# Patient Record
Sex: Female | Born: 1965 | Race: Black or African American | Hispanic: No | Marital: Single | Smoking: Current every day smoker
Health system: Southern US, Community
[De-identification: ages and names within clinical notes are randomized; demographics above are authoritative.]

## PROBLEM LIST (undated history)

## (undated) DIAGNOSIS — N189 Chronic kidney disease, unspecified: Secondary | ICD-10-CM

## (undated) DIAGNOSIS — Z993 Dependence on wheelchair: Secondary | ICD-10-CM

## (undated) DIAGNOSIS — F17200 Nicotine dependence, unspecified, uncomplicated: Secondary | ICD-10-CM

## (undated) DIAGNOSIS — I10 Essential (primary) hypertension: Secondary | ICD-10-CM

## (undated) DIAGNOSIS — E11319 Type 2 diabetes mellitus with unspecified diabetic retinopathy without macular edema: Secondary | ICD-10-CM

## (undated) DIAGNOSIS — I739 Peripheral vascular disease, unspecified: Secondary | ICD-10-CM

## (undated) DIAGNOSIS — E785 Hyperlipidemia, unspecified: Secondary | ICD-10-CM

## (undated) DIAGNOSIS — I2699 Other pulmonary embolism without acute cor pulmonale: Secondary | ICD-10-CM

## (undated) DIAGNOSIS — J45909 Unspecified asthma, uncomplicated: Secondary | ICD-10-CM

## (undated) DIAGNOSIS — I82409 Acute embolism and thrombosis of unspecified deep veins of unspecified lower extremity: Secondary | ICD-10-CM

## (undated) DIAGNOSIS — E119 Type 2 diabetes mellitus without complications: Secondary | ICD-10-CM

## (undated) DIAGNOSIS — G629 Polyneuropathy, unspecified: Secondary | ICD-10-CM

## (undated) DIAGNOSIS — J811 Chronic pulmonary edema: Secondary | ICD-10-CM

## (undated) DIAGNOSIS — D649 Anemia, unspecified: Secondary | ICD-10-CM

## (undated) DIAGNOSIS — Z992 Dependence on renal dialysis: Secondary | ICD-10-CM

## (undated) DIAGNOSIS — I8390 Asymptomatic varicose veins of unspecified lower extremity: Secondary | ICD-10-CM

## (undated) DIAGNOSIS — N186 End stage renal disease: Secondary | ICD-10-CM

## (undated) HISTORY — PX: EYE SURGERY: SHX253

## (undated) HISTORY — PX: TONSILLECTOMY: SUR1361

## (undated) HISTORY — DX: Essential (primary) hypertension: I10

## (undated) HISTORY — DX: Asymptomatic varicose veins of unspecified lower extremity: I83.90

---
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
## 2009-06-20 HISTORY — PX: TUBAL LIGATION: SHX77

## 2010-02-08 ENCOUNTER — Ambulatory Visit: Payer: Self-pay | Admitting: Obstetrics & Gynecology

## 2010-02-08 ENCOUNTER — Encounter: Admission: RE | Admit: 2010-02-08 | Discharge: 2010-03-18 | Payer: Self-pay | Admitting: Obstetrics & Gynecology

## 2010-02-09 ENCOUNTER — Ambulatory Visit (HOSPITAL_COMMUNITY): Admission: RE | Admit: 2010-02-09 | Discharge: 2010-02-09 | Payer: Self-pay | Admitting: Obstetrics & Gynecology

## 2010-02-15 ENCOUNTER — Ambulatory Visit: Payer: Self-pay | Admitting: Obstetrics & Gynecology

## 2010-02-15 LAB — CONVERTED CEMR LAB
ALT: 8 units/L (ref 0–35)
Alkaline Phosphatase: 70 units/L (ref 39–117)
AntiThromb III Func: 106 % (ref 76–126)
Anticardiolipin IgA: 2 (ref <22)
Anticardiolipin IgG: 5 (ref <23)
Anticardiolipin IgM: 4 (ref <11)
Collection Interval-CRCL: 24 hr
Creatinine 24 HR UR: 1352 mg/24hr (ref 700–1800)
Creatinine Clearance: 204 mL/min — ABNORMAL HIGH (ref 75–115)
Creatinine, Ser: 0.46 mg/dL (ref 0.40–1.20)
Creatinine, Urine: 117.6 mg/dL
MCHC: 32.2 g/dL (ref 30.0–36.0)
Protein S Activity: 68 % — ABNORMAL LOW (ref 69–129)
Protein S Ag, Total: 86 % (ref 70–140)
RDW: 14.1 % (ref 11.5–15.5)
Sodium: 137 meq/L (ref 135–145)
TSH: 1.508 microintl units/mL (ref 0.350–4.500)
Total Bilirubin: 0.3 mg/dL (ref 0.3–1.2)
Total Protein: 6.2 g/dL (ref 6.0–8.3)

## 2010-03-01 ENCOUNTER — Ambulatory Visit: Payer: Self-pay | Admitting: Obstetrics & Gynecology

## 2010-03-08 ENCOUNTER — Ambulatory Visit: Payer: Self-pay | Admitting: Family Medicine

## 2010-03-08 ENCOUNTER — Encounter (INDEPENDENT_AMBULATORY_CARE_PROVIDER_SITE_OTHER): Payer: Self-pay | Admitting: *Deleted

## 2010-03-08 LAB — CONVERTED CEMR LAB
HCT: 27.9 % — ABNORMAL LOW (ref 36.0–46.0)
MCHC: 33.3 g/dL (ref 30.0–36.0)
Platelets: 265 10*3/uL (ref 150–400)
RDW: 14.4 % (ref 11.5–15.5)

## 2010-03-15 ENCOUNTER — Ambulatory Visit: Payer: Self-pay | Admitting: Obstetrics & Gynecology

## 2010-03-15 ENCOUNTER — Encounter: Admission: RE | Admit: 2010-03-15 | Discharge: 2010-03-18 | Payer: Self-pay | Admitting: Obstetrics & Gynecology

## 2010-03-15 ENCOUNTER — Ambulatory Visit (HOSPITAL_COMMUNITY): Admission: RE | Admit: 2010-03-15 | Discharge: 2010-03-15 | Payer: Self-pay | Admitting: Obstetrics & Gynecology

## 2010-03-17 ENCOUNTER — Ambulatory Visit: Payer: Self-pay | Admitting: Obstetrics and Gynecology

## 2010-03-22 ENCOUNTER — Encounter (INDEPENDENT_AMBULATORY_CARE_PROVIDER_SITE_OTHER): Payer: Self-pay | Admitting: *Deleted

## 2010-03-22 ENCOUNTER — Ambulatory Visit: Payer: Self-pay | Admitting: Obstetrics & Gynecology

## 2010-03-26 ENCOUNTER — Ambulatory Visit: Payer: Self-pay | Admitting: Obstetrics & Gynecology

## 2010-03-29 ENCOUNTER — Ambulatory Visit: Payer: Self-pay | Admitting: Obstetrics and Gynecology

## 2010-03-29 ENCOUNTER — Encounter (INDEPENDENT_AMBULATORY_CARE_PROVIDER_SITE_OTHER): Payer: Self-pay | Admitting: *Deleted

## 2010-04-02 ENCOUNTER — Ambulatory Visit: Payer: Self-pay | Admitting: Family Medicine

## 2010-04-05 ENCOUNTER — Ambulatory Visit: Payer: Self-pay | Admitting: Family Medicine

## 2010-04-09 ENCOUNTER — Ambulatory Visit: Payer: Self-pay | Admitting: Obstetrics & Gynecology

## 2010-04-12 ENCOUNTER — Ambulatory Visit: Payer: Self-pay | Admitting: Obstetrics & Gynecology

## 2010-04-13 ENCOUNTER — Ambulatory Visit (HOSPITAL_COMMUNITY): Admission: RE | Admit: 2010-04-13 | Discharge: 2010-04-13 | Payer: Self-pay | Admitting: Obstetrics & Gynecology

## 2010-04-16 ENCOUNTER — Ambulatory Visit: Payer: Self-pay | Admitting: Obstetrics & Gynecology

## 2010-04-19 ENCOUNTER — Encounter: Payer: Self-pay | Admitting: Physician Assistant

## 2010-04-19 ENCOUNTER — Ambulatory Visit: Payer: Self-pay | Admitting: Family Medicine

## 2010-04-19 LAB — CONVERTED CEMR LAB: Chlamydia, Swab/Urine, PCR: NEGATIVE

## 2010-04-23 ENCOUNTER — Ambulatory Visit: Payer: Self-pay | Admitting: Obstetrics & Gynecology

## 2010-04-26 ENCOUNTER — Ambulatory Visit: Payer: Self-pay | Admitting: Obstetrics & Gynecology

## 2010-04-26 LAB — CONVERTED CEMR LAB: GC Probe Amp, Urine: NEGATIVE

## 2010-05-03 ENCOUNTER — Ambulatory Visit: Payer: Self-pay | Admitting: Obstetrics & Gynecology

## 2010-05-03 ENCOUNTER — Inpatient Hospital Stay (HOSPITAL_COMMUNITY)
Admission: AD | Admit: 2010-05-03 | Discharge: 2010-05-07 | Payer: Self-pay | Source: Home / Self Care | Admitting: Obstetrics & Gynecology

## 2010-05-05 HISTORY — PX: TUBAL LIGATION: SHX77

## 2010-05-27 ENCOUNTER — Ambulatory Visit: Payer: Self-pay | Admitting: Obstetrics and Gynecology

## 2010-06-04 ENCOUNTER — Ambulatory Visit: Payer: Self-pay | Admitting: Obstetrics and Gynecology

## 2010-08-31 LAB — CBC
HCT: 30.1 % — ABNORMAL LOW (ref 36.0–46.0)
HCT: 31 % — ABNORMAL LOW (ref 36.0–46.0)
HCT: 31.8 % — ABNORMAL LOW (ref 36.0–46.0)
Hemoglobin: 10.3 g/dL — ABNORMAL LOW (ref 12.0–15.0)
Hemoglobin: 10.8 g/dL — ABNORMAL LOW (ref 12.0–15.0)
Hemoglobin: 10.9 g/dL — ABNORMAL LOW (ref 12.0–15.0)
MCH: 32.9 pg (ref 26.0–34.0)
MCH: 32.9 pg (ref 26.0–34.0)
MCHC: 34.1 g/dL (ref 30.0–36.0)
MCV: 94.7 fL (ref 78.0–100.0)
MCV: 95.4 fL (ref 78.0–100.0)
MCV: 95.8 fL (ref 78.0–100.0)
Platelets: 210 10*3/uL (ref 150–400)
RBC: 3.08 MIL/uL — ABNORMAL LOW (ref 3.87–5.11)
RBC: 3.33 MIL/uL — ABNORMAL LOW (ref 3.87–5.11)
RDW: 14.5 % (ref 11.5–15.5)
RDW: 14.8 % (ref 11.5–15.5)
WBC: 6 10*3/uL (ref 4.0–10.5)
WBC: 6.6 10*3/uL (ref 4.0–10.5)
WBC: 8.5 10*3/uL (ref 4.0–10.5)

## 2010-08-31 LAB — PROTEIN / CREATININE RATIO, URINE
Creatinine, Urine: 40.3 mg/dL
Total Protein, Urine: 21 mg/dL

## 2010-08-31 LAB — COMPREHENSIVE METABOLIC PANEL
ALT: 14 U/L (ref 0–35)
Alkaline Phosphatase: 130 U/L — ABNORMAL HIGH (ref 39–117)
BUN: 6 mg/dL (ref 6–23)
Chloride: 105 mEq/L (ref 96–112)
Glucose, Bld: 86 mg/dL (ref 70–99)
Potassium: 3.7 mEq/L (ref 3.5–5.1)
Sodium: 136 mEq/L (ref 135–145)
Total Bilirubin: 0.2 mg/dL — ABNORMAL LOW (ref 0.3–1.2)
Total Protein: 6.5 g/dL (ref 6.0–8.3)

## 2010-08-31 LAB — GLUCOSE, CAPILLARY
Glucose-Capillary: 55 mg/dL — ABNORMAL LOW (ref 70–99)
Glucose-Capillary: 56 mg/dL — ABNORMAL LOW (ref 70–99)
Glucose-Capillary: 58 mg/dL — ABNORMAL LOW (ref 70–99)
Glucose-Capillary: 59 mg/dL — ABNORMAL LOW (ref 70–99)
Glucose-Capillary: 65 mg/dL — ABNORMAL LOW (ref 70–99)
Glucose-Capillary: 69 mg/dL — ABNORMAL LOW (ref 70–99)
Glucose-Capillary: 74 mg/dL (ref 70–99)
Glucose-Capillary: 78 mg/dL (ref 70–99)
Glucose-Capillary: 85 mg/dL (ref 70–99)
Glucose-Capillary: 86 mg/dL (ref 70–99)

## 2010-08-31 LAB — POCT URINALYSIS DIPSTICK
Glucose, UA: NEGATIVE mg/dL
Hgb urine dipstick: NEGATIVE
Ketones, ur: NEGATIVE mg/dL
Specific Gravity, Urine: >=1.03 (ref 1.005–1.030)
Urobilinogen, UA: 4 mg/dL — ABNORMAL HIGH (ref 0.0–1.0)

## 2010-08-31 LAB — PROTIME-INR
INR: 0.86 (ref 0.00–1.49)
INR: 0.87 (ref 0.00–1.49)
Prothrombin Time: 11.9 seconds (ref 11.6–15.2)

## 2010-08-31 LAB — MRSA PCR SCREENING: MRSA by PCR: NEGATIVE

## 2010-09-01 LAB — POCT URINALYSIS DIPSTICK
Glucose, UA: NEGATIVE mg/dL
Glucose, UA: NEGATIVE mg/dL
Glucose, UA: NEGATIVE mg/dL
Ketones, ur: NEGATIVE mg/dL
Nitrite: NEGATIVE
Nitrite: NEGATIVE
Nitrite: POSITIVE — AB
Protein, ur: 100 mg/dL — AB
Specific Gravity, Urine: >=1.03 (ref 1.005–1.030)
Urobilinogen, UA: 2 mg/dL — ABNORMAL HIGH (ref 0.0–1.0)
Urobilinogen, UA: 4 mg/dL — ABNORMAL HIGH (ref 0.0–1.0)
pH: 6 (ref 5.0–8.0)

## 2010-09-02 LAB — POCT URINALYSIS DIPSTICK
Bilirubin Urine: NEGATIVE
Glucose, UA: NEGATIVE mg/dL
Glucose, UA: NEGATIVE mg/dL
Glucose, UA: 100 mg/dL — AB
Glucose, UA: NEGATIVE mg/dL
Hgb urine dipstick: NEGATIVE
Ketones, ur: 15 mg/dL — AB
Nitrite: POSITIVE — AB
Nitrite: NEGATIVE
Nitrite: NEGATIVE
Protein, ur: 100 mg/dL — AB
Specific Gravity, Urine: >=1.03 (ref 1.005–1.030)
Specific Gravity, Urine: >=1.03 (ref 1.005–1.030)
Specific Gravity, Urine: 1.025 (ref 1.005–1.030)
Urobilinogen, UA: 2 mg/dL — ABNORMAL HIGH (ref 0.0–1.0)
Urobilinogen, UA: >=8 mg/dL (ref 0.0–1.0)
Urobilinogen, UA: 1 mg/dL (ref 0.0–1.0)
pH: 5.5 (ref 5.0–8.0)

## 2010-09-02 LAB — GLUCOSE, CAPILLARY: Glucose-Capillary: 56 mg/dL — ABNORMAL LOW (ref 70–99)

## 2010-09-03 LAB — POCT URINALYSIS DIPSTICK
Bilirubin Urine: NEGATIVE
Glucose, UA: 100 mg/dL — AB
Glucose, UA: NEGATIVE mg/dL
Ketones, ur: NEGATIVE mg/dL
Nitrite: NEGATIVE
Protein, ur: 100 mg/dL — AB
pH: 5.5 (ref 5.0–8.0)

## 2010-09-03 LAB — GLUCOSE, CAPILLARY: Glucose-Capillary: 123 mg/dL — ABNORMAL HIGH (ref 70–99)

## 2011-01-26 ENCOUNTER — Encounter: Payer: Self-pay | Admitting: *Deleted

## 2011-01-26 DIAGNOSIS — I10 Essential (primary) hypertension: Secondary | ICD-10-CM

## 2011-01-26 DIAGNOSIS — E119 Type 2 diabetes mellitus without complications: Secondary | ICD-10-CM | POA: Insufficient documentation

## 2011-01-26 DIAGNOSIS — Z86718 Personal history of other venous thrombosis and embolism: Secondary | ICD-10-CM | POA: Insufficient documentation

## 2011-02-04 ENCOUNTER — Ambulatory Visit (INDEPENDENT_AMBULATORY_CARE_PROVIDER_SITE_OTHER): Payer: BC Managed Care – PPO | Admitting: Family Medicine

## 2011-02-04 ENCOUNTER — Encounter: Payer: Self-pay | Admitting: Family Medicine

## 2011-02-04 VITALS — BP 120/81 | HR 94 | Temp 97.3°F | Ht 67.0 in | Wt 144.8 lb

## 2011-02-04 DIAGNOSIS — N898 Other specified noninflammatory disorders of vagina: Secondary | ICD-10-CM

## 2011-02-04 DIAGNOSIS — Z0000 Encounter for general adult medical examination without abnormal findings: Secondary | ICD-10-CM

## 2011-02-04 NOTE — Progress Notes (Signed)
Subjective:    Patient ID: Angela Merritt, female    DOB: Feb 21, 1966, 45 y.o.   MRN: [id number]  Gynecologic Exam The patient's pertinent negatives include no genital itching, genital lesions, genital odor, genital rash, missed menses, pelvic pain, vaginal bleeding or vaginal discharge. She is not pregnant. Pertinent negatives include no abdominal pain, chills, constipation, diarrhea, dysuria, fever, frequency, hematuria, nausea, sore throat or vomiting. She is not sexually active. She uses tubal ligation for contraception. Her menstrual history has been regular.      Review of Systems  Constitutional: Negative for fever, chills, activity change, appetite change and fatigue.  HENT: Negative for sore throat, trouble swallowing and voice change.   Respiratory: Negative for shortness of breath and wheezing.   Gastrointestinal: Negative for nausea, vomiting, abdominal pain, diarrhea, constipation, blood in stool, abdominal distention and anal bleeding.  Genitourinary: Negative for dysuria, frequency, hematuria, vaginal discharge, pelvic pain and missed menses.       Objective:   Physical Exam  Vitals reviewed. Constitutional: She appears well-developed and well-nourished. No distress.  Neck: Normal range of motion. Neck supple. No tracheal deviation present. No thyromegaly present.  Cardiovascular: Normal rate, regular rhythm and normal heart sounds.  Exam reveals no gallop and no friction rub.   No murmur heard. Pulmonary/Chest: Effort normal and breath sounds normal. No respiratory distress. She has no wheezes. She exhibits no tenderness.  Abdominal: Soft. She exhibits no distension and no mass. There is no tenderness. There is no rebound and no guarding. Hernia confirmed negative in the right inguinal area and confirmed negative in the left inguinal area.  Genitourinary: No breast swelling, tenderness, discharge or bleeding. Pelvic exam was performed with patient supine. There is no rash,  tenderness, lesion or injury on the right labia. There is no rash, tenderness, lesion or injury on the left labia. No erythema or bleeding around the vagina. No foreign body around the vagina. No signs of injury around the vagina. Vaginal discharge found.  Lymphadenopathy:       Right: No inguinal adenopathy present.       Left: No inguinal adenopathy present.  Skin: She is not diaphoretic.          Assessment & Plan:  1.  Well exam -  -PAP done.  Screening Mammogram ordered  2.  Vaginal d/c.  Pt without complaint.  Will send for wet prep.  GC/chl done.

## 2011-02-05 ENCOUNTER — Other Ambulatory Visit: Payer: Self-pay | Admitting: Family Medicine

## 2011-02-05 LAB — GC/CHLAMYDIA PROBE AMP, GENITAL: Chlamydia, DNA Probe: NEGATIVE

## 2011-02-07 LAB — WET PREP, GENITAL: Yeast Wet Prep HPF POC: NONE SEEN

## 2014-04-21 ENCOUNTER — Encounter: Payer: Self-pay | Admitting: Family Medicine

## 2014-10-14 ENCOUNTER — Emergency Department (HOSPITAL_BASED_OUTPATIENT_CLINIC_OR_DEPARTMENT_OTHER): Payer: 59

## 2014-10-14 ENCOUNTER — Encounter (HOSPITAL_BASED_OUTPATIENT_CLINIC_OR_DEPARTMENT_OTHER): Payer: Self-pay | Admitting: *Deleted

## 2014-10-14 ENCOUNTER — Emergency Department (HOSPITAL_BASED_OUTPATIENT_CLINIC_OR_DEPARTMENT_OTHER)
Admission: EM | Admit: 2014-10-14 | Discharge: 2014-10-14 | Disposition: A | Discharge status: Home / Self Care | Payer: 59 | Attending: Emergency Medicine | Admitting: Emergency Medicine

## 2014-10-14 DIAGNOSIS — Z79899 Other long term (current) drug therapy: Secondary | ICD-10-CM | POA: Insufficient documentation

## 2014-10-14 DIAGNOSIS — Z7901 Long term (current) use of anticoagulants: Secondary | ICD-10-CM | POA: Diagnosis not present

## 2014-10-14 DIAGNOSIS — R1110 Vomiting, unspecified: Secondary | ICD-10-CM | POA: Diagnosis not present

## 2014-10-14 DIAGNOSIS — J45909 Unspecified asthma, uncomplicated: Secondary | ICD-10-CM | POA: Insufficient documentation

## 2014-10-14 DIAGNOSIS — R059 Cough, unspecified: Secondary | ICD-10-CM

## 2014-10-14 DIAGNOSIS — I10 Essential (primary) hypertension: Secondary | ICD-10-CM | POA: Insufficient documentation

## 2014-10-14 DIAGNOSIS — E119 Type 2 diabetes mellitus without complications: Secondary | ICD-10-CM | POA: Diagnosis not present

## 2014-10-14 DIAGNOSIS — R05 Cough: Secondary | ICD-10-CM | POA: Insufficient documentation

## 2014-10-14 DIAGNOSIS — Z720 Tobacco use: Secondary | ICD-10-CM | POA: Diagnosis not present

## 2014-10-14 HISTORY — DX: Unspecified asthma, uncomplicated: J45.909

## 2014-10-14 NOTE — Discharge Instructions (Signed)
Cough, Adult   A cough is a reflex. It helps you clear your throat and airways. A cough can help heal your body. A cough can last 2 or 3 weeks (acute) or may last more than 8 weeks (chronic). Some common causes of a cough can include an infection, allergy, or a cold.  HOME CARE  · Only take medicine as told by your doctor.  · If given, take your medicines (antibiotics) as told. Finish them even if you start to feel better.  · Use a cold steam vaporizer or humidifier in your home. This can help loosen thick spit (secretions).  · Sleep so you are almost sitting up (semi-upright). Use pillows to do this. This helps reduce coughing.  · Rest as needed.  · Stop smoking if you smoke.  GET HELP RIGHT AWAY IF:  · You have yellowish-white fluid (pus) in your thick spit.  · Your cough gets worse.  · Your medicine does not reduce coughing, and you are losing sleep.  · You cough up blood.  · You have trouble breathing.  · Your pain gets worse and medicine does not help.  · You have a fever.  MAKE SURE YOU:   · Understand these instructions.  · Will watch your condition.  · Will get help right away if you are not doing well or get worse.  Document Released: 02/17/2011 Document Revised: 10/21/2013 Document Reviewed: 02/17/2011  [employer]® Patient Information ©2015 [employer]. This information is not intended to replace advice given to you by your health care provider. Make sure you discuss any questions you have with your health care provider.

## 2014-10-14 NOTE — ED Provider Notes (Signed)
CSN: [id number]     Arrival date & time 10/14/14  0941 History   First MD Initiated Contact with Patient 10/14/14 1155     Chief Complaint  Patient presents with  . Cough    Patient is a 49 y.o. female presenting with cough. The history is provided by the patient.  Cough Severity:  Moderate Onset quality:  Gradual Duration:  1 week Timing:  Intermittent Progression:  Improving Chronicity:  New Smoker: yes   Relieved by:  Nothing Worsened by:  Nothing tried Associated symptoms: no chest pain, no fever and no shortness of breath   Pt reports cough for one week No hemoptysis - yellow secretions reported No SOB She does report post tussive emesis   Past Medical History  Diagnosis Date  . Diabetes mellitus   . Hypertension   . Varicosities   . Childhood asthma    Past Surgical History  Procedure Laterality Date  . Tubal ligation  05/05/10  . Breast reduction surgery  1982  . Tonsillectomy     No family history on file. History  Substance Use Topics  . Smoking status: Current Every Day Smoker -- 0.50 packs/day for 20 years    Types: Cigarettes  . Smokeless tobacco: Never Used  . Alcohol Use: No   OB History    Gravida Para Term Preterm AB TAB SAB Ectopic Multiple Living   2 2 2  0 0 0 0 0 0 2     Review of Systems  Constitutional: Negative for fever.  Respiratory: Positive for cough. Negative for shortness of breath.   Cardiovascular: Negative for chest pain.  Gastrointestinal: Positive for vomiting.       Post tussive emesis       Allergies  Review of patient's allergies indicates no known allergies.  Home Medications   Prior to Admission medications   Medication Sig Start Date End Date Taking? Authorizing Provider  atorvastatin (LIPITOR) 20 MG tablet Take 20 mg by mouth daily.   Yes Historical Provider, MD  glipiZIDE (GLUCOTROL) 10 MG tablet Take 10 mg by mouth daily before breakfast.   Yes Historical Provider, MD  lisinopril (PRINIVIL,ZESTRIL) 5 MG  tablet Take 5 mg by mouth daily.   Yes Historical Provider, MD  loratadine (CLARITIN) 10 MG tablet Take 10 mg by mouth daily.   Yes Historical Provider, MD  metFORMIN (GLUCOPHAGE) 500 MG tablet Take 500 mg by mouth 2 (two) times daily with a meal.     Yes Historical Provider, MD  warfarin (COUMADIN) 1 MG tablet Take 1.5 mg by mouth at bedtime.     Yes Historical Provider, MD  Bevacizumab (AVASTIN) 100 MG/4ML SOLN Inject into the vein.    Historical Provider, MD  ferrous sulfate 325 (65 FE) MG tablet Take 325 mg by mouth daily with breakfast.      Historical Provider, MD  glyBURIDE (DIABETA) 5 MG tablet Take 5 mg by mouth daily with breakfast.      Historical Provider, MD  labetalol (NORMODYNE) 200 MG tablet Take 200 mg by mouth 2 (two) times daily.      Historical Provider, MD  Prenatal Vit-Fe Fumarate-FA (PRENATAL VITAMINS PLUS) 27-1 MG TABS Take by mouth.      Historical Provider, MD   BP 127/79 mmHg  Pulse 81  Temp(Src) 98.4 F (36.9 C) (Oral)  Resp 18  Ht 5\' 6"  (1.676 m)  Wt 145 lb (65.772 kg)  BMI 23.41 kg/m2  SpO2 100%  LMP 01/22/2011 Physical Exam CONSTITUTIONAL: Well  developed/well nourished HEAD: Normocephalic/atraumatic EYES: EOMI/PERRL ENMT: Mucous membranes moist NECK: supple no meningeal signs CV: S1/S2 noted, no murmurs/rubs/gallops noted LUNGS: Lungs are clear to auscultation bilaterally, no apparent distress ABDOMEN: soft, nontender, no rebound or guarding, bowel sounds noted throughout abdomen NEURO: Pt is awake/alert/appropriate, moves all extremitiesx4.  No facial droop.   EXTREMITIES: pulses normal/equal, full ROM SKIN: warm, color normal PSYCH: no abnormalities of mood noted, alert and oriented to situation  ED Course  Procedures    pt reports cough for one week No CP/SOB No hemoptysis She reports h/o DVT (on coumadin) but this appears more c/w URI (she is a smoker) I doubt PE/CHF at this time Advised to quit smoking Appropriate for d/c home Imaging  Review Dg Chest 2 View  10/14/2014   CLINICAL DATA:  Cough, congestion for 3 weeks.  EXAM: CHEST  2 VIEW  COMPARISON:  None.  FINDINGS: Mild peribronchial thickening. Heart and mediastinal contours are within normal limits. No focal opacities or effusions. No acute bony abnormality.  IMPRESSION: Mild bronchitic changes.   Electronically Signed   By: Charlett Nose M.D.   On: 10/14/2014 11:11      MDM   Final diagnoses:  Cough    Nursing notes including past medical history and social history reviewed and considered in documentation xrays/imaging reviewed by myself and considered during evaluation     Zadie Rhine, MD 10/14/14 1525

## 2014-10-14 NOTE — ED Notes (Signed)
Patient states she has had a cough for the last two weeks.  States it improved some, and now is back with intermittent productive yellow to white secretions, and is so intense it causes her to vomit while coughing.

## 2022-09-19 HISTORY — PX: ABOVE KNEE LEG AMPUTATION: SUR20

## 2022-11-19 DIAGNOSIS — U071 COVID-19: Secondary | ICD-10-CM

## 2022-11-19 DIAGNOSIS — J9600 Acute respiratory failure, unspecified whether with hypoxia or hypercapnia: Secondary | ICD-10-CM

## 2022-11-19 HISTORY — DX: COVID-19: U07.1

## 2022-11-19 HISTORY — DX: Acute respiratory failure, unspecified whether with hypoxia or hypercapnia: J96.00

## 2023-03-30 ENCOUNTER — Emergency Department (HOSPITAL_COMMUNITY): Payer: Medicaid Other

## 2023-03-30 ENCOUNTER — Inpatient Hospital Stay (HOSPITAL_COMMUNITY)
Admission: EM | Admit: 2023-03-30 | Discharge: 2023-04-21 | DRG: 308 | Disposition: E | Payer: Medicaid Other | Attending: Critical Care Medicine | Admitting: Critical Care Medicine

## 2023-03-30 DIAGNOSIS — Z515 Encounter for palliative care: Secondary | ICD-10-CM

## 2023-03-30 DIAGNOSIS — J9601 Acute respiratory failure with hypoxia: Secondary | ICD-10-CM

## 2023-03-30 DIAGNOSIS — I469 Cardiac arrest, cause unspecified: Principal | ICD-10-CM | POA: Diagnosis present

## 2023-03-30 DIAGNOSIS — Z992 Dependence on renal dialysis: Secondary | ICD-10-CM | POA: Diagnosis not present

## 2023-03-30 DIAGNOSIS — N186 End stage renal disease: Secondary | ICD-10-CM | POA: Diagnosis not present

## 2023-03-30 DIAGNOSIS — E1151 Type 2 diabetes mellitus with diabetic peripheral angiopathy without gangrene: Secondary | ICD-10-CM | POA: Diagnosis present

## 2023-03-30 DIAGNOSIS — Z8616 Personal history of COVID-19: Secondary | ICD-10-CM | POA: Diagnosis not present

## 2023-03-30 DIAGNOSIS — Z89512 Acquired absence of left leg below knee: Secondary | ICD-10-CM

## 2023-03-30 DIAGNOSIS — M898X9 Other specified disorders of bone, unspecified site: Secondary | ICD-10-CM | POA: Diagnosis present

## 2023-03-30 DIAGNOSIS — Z1152 Encounter for screening for COVID-19: Secondary | ICD-10-CM | POA: Diagnosis not present

## 2023-03-30 DIAGNOSIS — Z87891 Personal history of nicotine dependence: Secondary | ICD-10-CM

## 2023-03-30 DIAGNOSIS — I452 Bifascicular block: Secondary | ICD-10-CM | POA: Diagnosis present

## 2023-03-30 DIAGNOSIS — E1122 Type 2 diabetes mellitus with diabetic chronic kidney disease: Secondary | ICD-10-CM | POA: Diagnosis present

## 2023-03-30 DIAGNOSIS — E1165 Type 2 diabetes mellitus with hyperglycemia: Secondary | ICD-10-CM | POA: Diagnosis present

## 2023-03-30 DIAGNOSIS — R569 Unspecified convulsions: Secondary | ICD-10-CM | POA: Diagnosis not present

## 2023-03-30 DIAGNOSIS — Z993 Dependence on wheelchair: Secondary | ICD-10-CM

## 2023-03-30 DIAGNOSIS — I4891 Unspecified atrial fibrillation: Secondary | ICD-10-CM | POA: Diagnosis present

## 2023-03-30 DIAGNOSIS — I462 Cardiac arrest due to underlying cardiac condition: Secondary | ICD-10-CM | POA: Diagnosis present

## 2023-03-30 DIAGNOSIS — G931 Anoxic brain damage, not elsewhere classified: Secondary | ICD-10-CM | POA: Diagnosis present

## 2023-03-30 DIAGNOSIS — D631 Anemia in chronic kidney disease: Secondary | ICD-10-CM | POA: Diagnosis present

## 2023-03-30 DIAGNOSIS — E785 Hyperlipidemia, unspecified: Secondary | ICD-10-CM | POA: Diagnosis present

## 2023-03-30 DIAGNOSIS — E872 Acidosis, unspecified: Secondary | ICD-10-CM | POA: Diagnosis present

## 2023-03-30 DIAGNOSIS — E875 Hyperkalemia: Secondary | ICD-10-CM | POA: Diagnosis present

## 2023-03-30 DIAGNOSIS — Z66 Do not resuscitate: Secondary | ICD-10-CM | POA: Diagnosis present

## 2023-03-30 DIAGNOSIS — Z9911 Dependence on respirator [ventilator] status: Secondary | ICD-10-CM | POA: Diagnosis not present

## 2023-03-30 DIAGNOSIS — I120 Hypertensive chronic kidney disease with stage 5 chronic kidney disease or end stage renal disease: Secondary | ICD-10-CM | POA: Diagnosis present

## 2023-03-30 DIAGNOSIS — I4901 Ventricular fibrillation: Principal | ICD-10-CM | POA: Diagnosis present

## 2023-03-30 DIAGNOSIS — R739 Hyperglycemia, unspecified: Secondary | ICD-10-CM | POA: Diagnosis not present

## 2023-03-30 DIAGNOSIS — I4510 Unspecified right bundle-branch block: Secondary | ICD-10-CM | POA: Diagnosis present

## 2023-03-30 HISTORY — DX: Nicotine dependence, unspecified, uncomplicated: F17.200

## 2023-03-30 HISTORY — DX: Type 2 diabetes mellitus with unspecified diabetic retinopathy without macular edema: E11.319

## 2023-03-30 HISTORY — DX: Type 2 diabetes mellitus without complications: E11.9

## 2023-03-30 HISTORY — DX: Dependence on renal dialysis: Z99.2

## 2023-03-30 HISTORY — DX: Essential (primary) hypertension: I10

## 2023-03-30 HISTORY — DX: Other pulmonary embolism without acute cor pulmonale: I26.99

## 2023-03-30 HISTORY — DX: Anemia, unspecified: D64.9

## 2023-03-30 HISTORY — DX: Chronic kidney disease, unspecified: N18.9

## 2023-03-30 HISTORY — DX: Peripheral vascular disease, unspecified: I73.9

## 2023-03-30 HISTORY — DX: Chronic pulmonary edema: J81.1

## 2023-03-30 HISTORY — DX: Acute embolism and thrombosis of unspecified deep veins of unspecified lower extremity: I82.409

## 2023-03-30 HISTORY — DX: Dependence on wheelchair: Z99.3

## 2023-03-30 HISTORY — DX: Hyperlipidemia, unspecified: E78.5

## 2023-03-30 HISTORY — DX: Polyneuropathy, unspecified: G62.9

## 2023-03-30 HISTORY — DX: Unspecified asthma, uncomplicated: J45.909

## 2023-03-30 HISTORY — DX: End stage renal disease: N18.6

## 2023-03-30 LAB — URINALYSIS, ROUTINE W REFLEX MICROSCOPIC
Bacteria, UA: NONE SEEN
Bilirubin Urine: NEGATIVE
Glucose, UA: >=500 mg/dL — AB
Ketones, ur: NEGATIVE mg/dL
Leukocytes,Ua: NEGATIVE
Nitrite: NEGATIVE
Protein, ur: >=300 mg/dL — AB
Specific Gravity, Urine: 1.016 (ref 1.005–1.030)
pH: 6 (ref 5.0–8.0)

## 2023-03-30 LAB — CBC WITH DIFFERENTIAL/PLATELET
Abs Immature Granulocytes: 0.77 10*3/uL — ABNORMAL HIGH (ref 0.00–0.07)
Basophils Absolute: 0.1 10*3/uL (ref 0.0–0.1)
Basophils Relative: 1 %
Eosinophils Absolute: 0.1 10*3/uL (ref 0.0–0.5)
Eosinophils Relative: 1 %
HCT: 29.9 % — ABNORMAL LOW (ref 36.0–46.0)
Hemoglobin: 9.6 g/dL — ABNORMAL LOW (ref 12.0–15.0)
Immature Granulocytes: 5 %
Lymphocytes Relative: 44 %
Lymphs Abs: 6.6 10*3/uL — ABNORMAL HIGH (ref 0.7–4.0)
MCH: 33.4 pg (ref 26.0–34.0)
MCHC: 32.1 g/dL (ref 30.0–36.0)
MCV: 104.2 fL — ABNORMAL HIGH (ref 80.0–100.0)
Monocytes Absolute: 0.7 10*3/uL (ref 0.1–1.0)
Monocytes Relative: 5 %
Neutro Abs: 6.9 10*3/uL (ref 1.7–7.7)
Neutrophils Relative %: 44 %
Platelets: 352 10*3/uL (ref 150–400)
RBC: 2.87 MIL/uL — ABNORMAL LOW (ref 3.87–5.11)
RDW: 16 % — ABNORMAL HIGH (ref 11.5–15.5)
WBC: 15.1 10*3/uL — ABNORMAL HIGH (ref 4.0–10.5)
nRBC: 2.3 % — ABNORMAL HIGH (ref 0.0–0.2)

## 2023-03-30 LAB — RAPID URINE DRUG SCREEN, HOSP PERFORMED
Amphetamines: NOT DETECTED
Barbiturates: NOT DETECTED
Benzodiazepines: NOT DETECTED
Cocaine: NOT DETECTED
Opiates: NOT DETECTED
Tetrahydrocannabinol: NOT DETECTED

## 2023-03-30 LAB — I-STAT VENOUS BLOOD GAS, ED
Acid-base deficit: 11 mmol/L — ABNORMAL HIGH (ref 0.0–2.0)
Acid-base deficit: 10 mmol/L — ABNORMAL HIGH (ref 0.0–2.0)
Bicarbonate: 17.7 mmol/L — ABNORMAL LOW (ref 20.0–28.0)
Bicarbonate: 17.9 mmol/L — ABNORMAL LOW (ref 20.0–28.0)
Calcium, Ion: 1.23 mmol/L (ref 1.15–1.40)
Calcium, Ion: 1.2 mmol/L (ref 1.15–1.40)
HCT: 30 % — ABNORMAL LOW (ref 36.0–46.0)
HCT: 33 % — ABNORMAL LOW (ref 36.0–46.0)
Hemoglobin: 10.2 g/dL — ABNORMAL LOW (ref 12.0–15.0)
Hemoglobin: 11.2 g/dL — ABNORMAL LOW (ref 12.0–15.0)
O2 Saturation: 98 %
O2 Saturation: 99 %
Potassium: 3.9 mmol/L (ref 3.5–5.1)
Potassium: 3.6 mmol/L (ref 3.5–5.1)
Sodium: 132 mmol/L — ABNORMAL LOW (ref 135–145)
Sodium: 133 mmol/L — ABNORMAL LOW (ref 135–145)
TCO2: 19 mmol/L — ABNORMAL LOW (ref 22–32)
TCO2: 19 mmol/L — ABNORMAL LOW (ref 22–32)
pCO2, Ven: 51.4 mm[Hg] (ref 44–60)
pCO2, Ven: 45.7 mm[Hg] (ref 44–60)
pH, Ven: 7.145 — CL (ref 7.25–7.43)
pH, Ven: 7.202 — ABNORMAL LOW (ref 7.25–7.43)
pO2, Ven: 125 mm[Hg] — ABNORMAL HIGH (ref 32–45)
pO2, Ven: 158 mm[Hg] — ABNORMAL HIGH (ref 32–45)

## 2023-03-30 LAB — I-STAT CHEM 8, ED
BUN: 52 mg/dL — ABNORMAL HIGH (ref 6–20)
Calcium, Ion: 1.23 mmol/L (ref 1.15–1.40)
Chloride: 102 mmol/L (ref 98–111)
Creatinine, Ser: 5 mg/dL — ABNORMAL HIGH (ref 0.44–1.00)
Glucose, Bld: 679 mg/dL (ref 70–99)
HCT: 29 % — ABNORMAL LOW (ref 36.0–46.0)
Hemoglobin: 9.9 g/dL — ABNORMAL LOW (ref 12.0–15.0)
Potassium: 3.9 mmol/L (ref 3.5–5.1)
Sodium: 134 mmol/L — ABNORMAL LOW (ref 135–145)
TCO2: 19 mmol/L — ABNORMAL LOW (ref 22–32)

## 2023-03-30 LAB — COMPREHENSIVE METABOLIC PANEL
ALT: 190 U/L — ABNORMAL HIGH (ref 0–44)
AST: 282 U/L — ABNORMAL HIGH (ref 15–41)
Albumin: 2.9 g/dL — ABNORMAL LOW (ref 3.5–5.0)
Alkaline Phosphatase: 163 U/L — ABNORMAL HIGH (ref 38–126)
Anion gap: 21 — ABNORMAL HIGH (ref 5–15)
BUN: 43 mg/dL — ABNORMAL HIGH (ref 6–20)
CO2: 16 mmol/L — ABNORMAL LOW (ref 22–32)
Calcium: 9.6 mg/dL (ref 8.9–10.3)
Chloride: 96 mmol/L — ABNORMAL LOW (ref 98–111)
Creatinine, Ser: 4.71 mg/dL — ABNORMAL HIGH (ref 0.44–1.00)
GFR, Estimated: 10 mL/min — ABNORMAL LOW (ref >60)
Glucose, Bld: 668 mg/dL (ref 70–99)
Potassium: 3.8 mmol/L (ref 3.5–5.1)
Sodium: 133 mmol/L — ABNORMAL LOW (ref 135–145)
Total Bilirubin: 0.7 mg/dL (ref 0.3–1.2)
Total Protein: 5.8 g/dL — ABNORMAL LOW (ref 6.5–8.1)

## 2023-03-30 LAB — I-STAT ARTERIAL BLOOD GAS, ED
Acid-base deficit: 8 mmol/L — ABNORMAL HIGH (ref 0.0–2.0)
Bicarbonate: 18 mmol/L — ABNORMAL LOW (ref 20.0–28.0)
Calcium, Ion: 1.22 mmol/L (ref 1.15–1.40)
HCT: 31 % — ABNORMAL LOW (ref 36.0–46.0)
Hemoglobin: 10.5 g/dL — ABNORMAL LOW (ref 12.0–15.0)
O2 Saturation: 98 %
Patient temperature: 98.2
Potassium: 3.7 mmol/L (ref 3.5–5.1)
Sodium: 132 mmol/L — ABNORMAL LOW (ref 135–145)
TCO2: 19 mmol/L — ABNORMAL LOW (ref 22–32)
pCO2 arterial: 36.5 mm[Hg] (ref 32–48)
pH, Arterial: 7.3 — ABNORMAL LOW (ref 7.35–7.45)
pO2, Arterial: 110 mm[Hg] — ABNORMAL HIGH (ref 83–108)

## 2023-03-30 LAB — APTT: aPTT: 29 s (ref 24–36)

## 2023-03-30 LAB — GLUCOSE, CAPILLARY
Glucose-Capillary: >600 mg/dL (ref 70–99)
Glucose-Capillary: >600 mg/dL (ref 70–99)

## 2023-03-30 LAB — BETA-HYDROXYBUTYRIC ACID: Beta-Hydroxybutyric Acid: 0.21 mmol/L (ref 0.05–0.27)

## 2023-03-30 LAB — MAGNESIUM: Magnesium: 2.5 mg/dL — ABNORMAL HIGH (ref 1.7–2.4)

## 2023-03-30 LAB — PROTIME-INR
INR: 1.2 (ref 0.8–1.2)
Prothrombin Time: 15.8 s — ABNORMAL HIGH (ref 11.4–15.2)

## 2023-03-30 LAB — I-STAT CG4 LACTIC ACID, ED: Lactic Acid, Venous: 10.2 mmol/L (ref 0.5–1.9)

## 2023-03-30 LAB — BRAIN NATRIURETIC PEPTIDE: B Natriuretic Peptide: 271.6 pg/mL — ABNORMAL HIGH (ref 0.0–100.0)

## 2023-03-30 LAB — TROPONIN I (HIGH SENSITIVITY): Troponin I (High Sensitivity): 71 ng/L — ABNORMAL HIGH (ref <18)

## 2023-03-30 MED ORDER — INSULIN ASPART 100 UNIT/ML IJ SOLN: 0.0000 [IU] | INTRAMUSCULAR | Status: DC

## 2023-03-30 MED ORDER — FENTANYL CITRATE PF 50 MCG/ML IJ SOSY: 50.0000 ug | PREFILLED_SYRINGE | INTRAMUSCULAR | Status: DC | PRN

## 2023-03-30 MED ORDER — HEPARIN SODIUM (PORCINE) 5000 UNIT/ML IJ SOLN
5000.0000 [IU] | Freq: Three times a day (TID) | INTRAMUSCULAR | Status: DC
  Administered 2023-03-30 – 2023-04-04 (×14): 5000 [IU] via SUBCUTANEOUS
  Filled 2023-03-30 (×14): qty 1

## 2023-03-30 MED ORDER — LABETALOL HCL 5 MG/ML IV SOLN
10.0000 mg | INTRAVENOUS | Status: DC | PRN
  Administered 2023-03-31 (×3): 20 mg via INTRAVENOUS
  Administered 2023-03-31: 10 mg via INTRAVENOUS
  Administered 2023-03-31 – 2023-04-02 (×9): 20 mg via INTRAVENOUS
  Administered 2023-04-03: 10 mg via INTRAVENOUS
  Filled 2023-03-30 (×14): qty 4

## 2023-03-30 MED ORDER — PROPOFOL 1000 MG/100ML IV EMUL
5.0000 ug/kg/min | INTRAVENOUS | Status: DC
  Administered 2023-03-30 – 2023-03-31 (×2): 30 ug/kg/min via INTRAVENOUS
  Administered 2023-03-31: 10 ug/kg/min via INTRAVENOUS
  Filled 2023-03-30 (×2): qty 100

## 2023-03-30 MED ORDER — FAMOTIDINE 20 MG PO TABS: 20.0000 mg | ORAL_TABLET | Freq: Two times a day (BID) | ORAL | Status: DC

## 2023-03-30 MED ORDER — ACETAMINOPHEN 160 MG/5ML PO SOLN
650.0000 mg | ORAL | Status: AC
  Administered 2023-03-31 – 2023-04-01 (×8): 650 mg
  Filled 2023-03-30 (×8): qty 20.3

## 2023-03-30 MED ORDER — ACETAMINOPHEN 325 MG PO TABS: 650.0000 mg | ORAL_TABLET | ORAL | Status: AC

## 2023-03-30 MED ORDER — FENTANYL BOLUS VIA INFUSION: 50.0000 ug | INTRAVENOUS | Status: DC | PRN

## 2023-03-30 MED ORDER — ACETAMINOPHEN 650 MG RE SUPP: 650.0000 mg | RECTAL | Status: DC | PRN

## 2023-03-30 MED ORDER — MAGNESIUM SULFATE 2 GM/50ML IV SOLN: 2.0000 g | Freq: Once | INTRAVENOUS | Status: DC | PRN

## 2023-03-30 MED ORDER — INSULIN REGULAR(HUMAN) IN NACL 100-0.9 UT/100ML-% IV SOLN
INTRAVENOUS | Status: DC
  Administered 2023-03-30 – 2023-03-31 (×2): 8.5 [IU]/h via INTRAVENOUS
  Administered 2023-04-01: 6 [IU]/h via INTRAVENOUS
  Administered 2023-04-01: 22 [IU]/h via INTRAVENOUS
  Administered 2023-04-02: 18 [IU]/h via INTRAVENOUS
  Administered 2023-04-02: 10 [IU]/h via INTRAVENOUS
  Administered 2023-04-03: 8 [IU]/h via INTRAVENOUS
  Administered 2023-04-03: 3.2 [IU]/h via INTRAVENOUS
  Administered 2023-04-04: 2.6 [IU]/h via INTRAVENOUS
  Filled 2023-03-30 (×11): qty 100

## 2023-03-30 MED ORDER — DEXTROSE 50 % IV SOLN
0.0000 mL | INTRAVENOUS | Status: DC | PRN
  Filled 2023-03-30: qty 50

## 2023-03-30 MED ORDER — FENTANYL CITRATE PF 50 MCG/ML IJ SOSY: 50.0000 ug | PREFILLED_SYRINGE | Freq: Once | INTRAMUSCULAR | Status: DC

## 2023-03-30 MED ORDER — FENTANYL 2500MCG IN NS 250ML (10MCG/ML) PREMIX INFUSION
50.0000 ug/h | INTRAVENOUS | Status: DC
  Administered 2023-03-30: 50 ug/h via INTRAVENOUS
  Filled 2023-03-30: qty 250

## 2023-03-30 MED ORDER — INSULIN REGULAR(HUMAN) IN NACL 100-0.9 UT/100ML-% IV SOLN: INTRAVENOUS | Status: DC

## 2023-03-30 MED ORDER — POLYETHYLENE GLYCOL 3350 17 G PO PACK
17.0000 g | PACK | Freq: Every day | ORAL | Status: DC
  Administered 2023-04-04: 17 g
  Filled 2023-03-30: qty 1

## 2023-03-30 MED ORDER — PROPOFOL 1000 MG/100ML IV EMUL: 0.0000 ug/kg/min | INTRAVENOUS | Status: DC

## 2023-03-30 MED ORDER — IOHEXOL 350 MG/ML SOLN
75.0000 mL | Freq: Once | INTRAVENOUS | Status: AC | PRN
  Administered 2023-03-30: 75 mL via INTRAVENOUS

## 2023-03-30 MED ORDER — ACETAMINOPHEN 325 MG PO TABS: 650.0000 mg | ORAL_TABLET | ORAL | Status: DC | PRN

## 2023-03-30 MED ORDER — BUSPIRONE HCL 10 MG PO TABS: 30.0000 mg | ORAL_TABLET | Freq: Three times a day (TID) | ORAL | Status: DC | PRN

## 2023-03-30 MED ORDER — ACETAMINOPHEN 650 MG RE SUPP
650.0000 mg | RECTAL | Status: AC
  Filled 2023-03-30: qty 1

## 2023-03-30 MED ORDER — DOCUSATE SODIUM 50 MG/5ML PO LIQD
100.0000 mg | Freq: Two times a day (BID) | ORAL | Status: DC
  Administered 2023-03-31 – 2023-04-04 (×4): 100 mg
  Filled 2023-03-30 (×5): qty 10

## 2023-03-30 MED ORDER — ONDANSETRON HCL 4 MG/2ML IJ SOLN: 4.0000 mg | Freq: Four times a day (QID) | INTRAMUSCULAR | Status: DC | PRN

## 2023-03-30 MED ORDER — SODIUM CHLORIDE 0.9 % IV SOLN: INTRAVENOUS | Status: AC | PRN

## 2023-03-30 MED ORDER — FAMOTIDINE 20 MG PO TABS
20.0000 mg | ORAL_TABLET | Freq: Two times a day (BID) | ORAL | Status: DC
  Administered 2023-03-30 – 2023-04-02 (×6): 20 mg
  Filled 2023-03-30 (×6): qty 1

## 2023-03-30 MED ORDER — ACETAMINOPHEN 160 MG/5ML PO SOLN
650.0000 mg | ORAL | Status: DC | PRN
  Administered 2023-04-02: 650 mg
  Filled 2023-03-30: qty 20.3

## 2023-03-30 MED ORDER — NOREPINEPHRINE 4 MG/250ML-% IV SOLN: 2.0000 ug/min | INTRAVENOUS | Status: DC

## 2023-03-30 MED ORDER — ROCURONIUM BROMIDE 50 MG/5ML IV SOLN
INTRAVENOUS | Status: DC | PRN
Start: 2023-03-30 — End: 2023-03-30
  Administered 2023-03-30: 100 mg via INTRAVENOUS

## 2023-03-30 MED ORDER — ETOMIDATE 2 MG/ML IV SOLN
INTRAVENOUS | Status: DC | PRN
Start: 2023-03-30 — End: 2023-03-30
  Administered 2023-03-30: 20 mg via INTRAVENOUS

## 2023-03-30 NOTE — Progress Notes (Addendum)
eLink Physician-Brief Progress Note Patient Name: Angela Merritt DOB: [birth year] MRN: [id number]   Date of Service  04/18/2023  HPI/Events of Note  57 year old with a history of end-stage renal disease, essential hypertension, diabetes mellitus status post out-of-hospital cardiac arrest after dialysis session.  Intubated in the emergency department placed on the ventilator to secure her airway.  On examination, she is minimally hypothermic, hypertensive, and saturating 100% on 40% FiO2.  Ventilated and sedated.  Initial results show hyperglycemia, anion gap metabolic acidosis with transaminitis, elevated troponin, and elevated creatinine.  Leukocytosis present.  Macrocytic anemia.  Beta-hydroxybutyrate negative.  Glucose in the 600s.  Glucosuria, proteinuria noted on urinalysis.  CT head pending.  CT abdomen/pelvis and chest personally reviewed-no obvious pulmonary infiltrates.  Abdomen with copious amounts of feces in the colon.  OG tube in appropriate positioning.  Endotracheal tube in appropriate positioning.  EKG with a right bundle branch block in the setting of A-fib.  eICU Interventions  Place Flexi-Seal given continuous type VII bowel movements.  Initiate insulin drip in place of sliding scale insulin given persistent hyperglycemia  Vasopressors to maintain MAP greater than 65  Maintain normothermia, avoid fevers.  Trend troponins till nadir.  Obtain echocardiogram  DVT prophylaxis with heparin subcutaneous GI prophylaxis with famotidine   0204 -add as needed antihypertensives  0451 -Trope elevated to 1400, lactate at 10.1.  Continue to trend.  0622 - K 3.7, Mg 1.8.  Replacements ordered  Intervention Category Evaluation Type: New Patient Evaluation  Angela Merritt 03/26/2023, 11:08 PM

## 2023-03-30 NOTE — H&P (Addendum)
NAME:  Angela Merritt, MRN:  [id number], DOB:  03/28/1960, LOS: 0 ADMISSION DATE:  04-16-23, CONSULTATION DATE:  04/16/23  REFERRING MD:  Jearld Fenton, EDP , CHIEF COMPLAINT: Cardiac arrest  History of Present Illness:  57 year old dialysis patient who presents postcardiac arrest.  History obtained after speaking to EDP and reviewing medical record. She completed her session of dialysis yesterday, then collapsed, received bystander CPR.  Shock administer by fire, ROSC obtained after 8 minutes of CPR, then lost pulses again with additional 15 minutes of CPR, shocked x 3, 4 doses of epi.  Had spontaneous breathing, required family grams of Versed and 100 mcg of IV fentanyl and route due to gagging, required transcutaneous pacing for heart rate in the 40s.  Code STEMI called to the EKG showing elevations in inferior leads and and diffuse depressions.  Intubated after arrival to ED, placed on propofol and fentanyl drips.  Code STEMI called off EKG shows incomplete RBBB pattern with atrial fibrillation  Per aunt, her date of birth is 05/12/1966  Pertinent  Medical History  ESRD on HD M/W/F-left arm AV fistula HTN DM-2 Wheelchair  Significant Hospital Events: Including procedures, antibiotic start and stop dates in addition to other pertinent events     Interim History / Subjective:  Examined in the ED Intubated and sedated   Objective   Blood pressure (!) 189/86, pulse (!) 110, temperature (!) 96.7 F (35.9 C), resp. rate (!) 23, SpO2 100%.       No intake or output data in the 24 hours ending 2023-04-16 2017 There were no vitals filed for this visit.  Examination: General: Elderly, obese woman, orally intubated, no distress HENT: Pupils 2 mm, not reactive to light, mild pallor, no icterus Lungs: Bilateral ventilated breath sounds, no accessory muscle use Cardiovascular: S1-S2 irregular, distant Abdomen: Soft, nontender, no hepatosplenomegaly Extremities: LLE amputee, left arm AV  fistula thrill Neuro: Unresponsive, RASS -3, some spontaneous breathing  VBG 7.1 4/51/125  Labs show lactate 10.2, hemoglobin 10, sodium 132, glucose 679, BUN/creatinine 52/5.0, potassium 3.9  Chest x-ray shows ET tube in OG tube in position, no infiltrates or effusions Resolved Hospital Problem list     Assessment & Plan:  VF cardiac arrest postdialysis EKG showing RBBB with LAHB, appears to be primary cardiac etiology Occurred postdialysis- electrolyte shifts possible, will check magnesium for completion Code STEMI was called off  -Cardiology consult, defer to them need for amio -Cycle troponins, echocardiogram in a.m. -Obtain more cardiac history when able and home meds -Normothermia protocol -Using propofol with intermittent fentanyl for goal RASS 0 to -1 -Await pan CT results  Acute respiratory failure with hypoxia - vent settings reviewed and adjusted  Hypertension -on labetalol and lisinopril at home Use labetalol as needed  Diabetes type 2  LLE amputee -Use SSI  Best Practice (right click and "Reselect all SmartList Selections" daily)   Diet/type: NPO w/ meds via tube DVT prophylaxis: prophylactic heparin  GI prophylaxis: H2B Lines: N/A Foley:  N/A Code Status:  full code Last date of multidisciplinary goals of care discussion [NA] updated aunt Merdis Delay  Labs   CBC: Recent Labs  Lab 2023-04-16 1931  HGB 9.9*  10.2*  HCT 29.0*  30.0*    Basic Metabolic Panel: Recent Labs  Lab Apr 16, 2023 1931  NA 134*  132*  K 3.9  3.9  CL 102  GLUCOSE 679*  BUN 52*  CREATININE 5.00*   GFR: CrCl cannot be calculated (Unknown ideal weight.). Recent Labs  Lab 04/16/2023 1932  LATICACIDVEN 10.2*    Liver Function Tests: No results for input(s): "AST", "ALT", "ALKPHOS", "BILITOT", "PROT", "ALBUMIN" in the last 168 hours. No results for input(s): "LIPASE", "AMYLASE" in the last 168 hours. No results for input(s): "AMMONIA" in the last 168 hours.  ABG     Component Value Date/Time   HCO3 17.7 (L) 03/30/2023 1931   TCO2 19 (L) 03/30/2023 1931   TCO2 19 (L) 03/30/2023 1931   ACIDBASEDEF 11.0 (H) 03/30/2023 1931   O2SAT 98 03/30/2023 1931     Coagulation Profile: No results for input(s): "INR", "PROTIME" in the last 168 hours.  Cardiac Enzymes: No results for input(s): "CKTOTAL", "CKMB", "CKMBINDEX", "TROPONINI" in the last 168 hours.  HbA1C: No results found for: "HGBA1C"  CBG: No results for input(s): "GLUCAP" in the last 168 hours.  Review of Systems:   Unable to obtain  Past Medical History:  She,  has no past medical history on file.   Surgical History:  LLE amputation   Social History:   Non-smoker No alcohol use  Family History:  Her family history is not on file.   Allergies Not on File   Home Medications  Prior to Admission medications   Not on File     Critical care time: 48 m    Cyril Mourning MD. [facility]. [place] [facility] Pager : [phone]  If no response to pager , please call [phone] until 7 pm After 7:00 pm call Elink  [phone]   03/30/2023

## 2023-03-30 NOTE — Progress Notes (Signed)
RT transported pt from [facility] to [facility] with RN at bedside. No complications at this time.

## 2023-03-30 NOTE — ED Triage Notes (Signed)
Pt arrives to ED post witnessed arrest. Pt fell to floor from wheelchair. Pt is dialysis pt, received 5 total shocks per EMS and fire when pt found in v-fib. Pt went into arrest for 3 total times. Pt given 4 epi, 1 gram of calcium, Bi-carb,  of fentanyl. 5mg  of versed in route.

## 2023-03-30 NOTE — ED Provider Notes (Signed)
[facility] EMERGENCY DEPARTMENT AT [facility] Provider Note   CSN: [id number] Arrival date & time: 03/30/23  1921     History  Chief Complaint  Patient presents with   Cardiac Arrest    Angela Merritt is a 57 y.o. female with PMH as listed below who presents via EMS as a postcardiac arrest.  Patient was at dialysis today and had received a full session when afterwards she collapsed.  She fell to the floor from her wheelchair.  She received immediate bystander CPR and subsequently CPR with fire who administered 1 shock for V-fib.  EMS performed approximately 8 minutes of additional CPR after which she obtained ROSC for approximately 6 minutes, then lost pulses again.  Total she received approximately 15 minutes of CPR with 3 shocks for V-fib intermittently mixed with PEA.  Also received 1 amp of bicarb, 1 g of calcium chloride, and 4 doses of epinephrine.  She obtained ROSC at 1834.  EMS placed an iGel for airway support however she was breathing on her own approximately 10 times per minute.  They administered 5 mg IV Versed total and 100 mcg IV fentanyl for sedation due to patient gagging on the iGel.  She did not have purposeful movement. At one point was bradycardic in 40s and required transcutaneous pacing. Post ROSC EKG demonstrated Afib with elevations in inferior leads and diffuse depressions.  Therefore a STEMI code was called from the field.  Elevations in inferior leads subsequently improved. Unfortunately no family at bedside and no history in patient's chart. Access via R tibial IO and 20g R AC from EMS.   No past medical history on file.     Home Medications Prior to Admission medications   Not on File      Allergies    Patient has no allergy information on record.    Review of Systems   Review of Systems A 10 point review of systems was performed and is negative unless otherwise reported in HPI.  Physical Exam Updated Vital Signs BP (!) 189/86   Pulse (!)  110   Temp (!) 96.7 F (35.9 C)   Resp (!) 23   SpO2 100%  Physical Exam General: ill- appearing female, lying in bed.  HEENT: Pupils midrange, nonreactive. Sclera anicteric, edentulous, MMM, trachea midline.  Cardiology: irregularly irregular rhythm, no murmurs/rubs/gallops. BL radial and DP pulses equal bilaterally.  Resp: 10 breaths per minute spontaneously supported by BVM with iGel. Coarse breath sounds bilaterally.  Abd: Soft, non-tender, +distended. No rebound tenderness or guarding.  GU: unremarkable, no blood at urethral meatus MSK: S/p L AKA. R tibial IO in place. No peripheral edema or signs of trauma. Extremities without deformity. Skin: cool, dry.  Neuro: GCS 3-4, intermittently gagging on the iGel,  ED Results / Procedures / Treatments   Labs (all labs ordered are listed, but only abnormal results are displayed) Labs Reviewed  I-STAT VENOUS BLOOD GAS, ED - Abnormal; Notable for the following components:      Result Value   pH, Ven 7.145 (*)    pO2, Ven 125 (*)    Bicarbonate 17.7 (*)    TCO2 19 (*)    Acid-base deficit 11.0 (*)    Sodium 132 (*)    HCT 30.0 (*)    Hemoglobin 10.2 (*)    All other components within normal limits  I-STAT CHEM 8, ED - Abnormal; Notable for the following components:   Sodium 134 (*)    BUN 52 (*)  Creatinine, Ser 5.00 (*)    Glucose, Bld 679 (*)    TCO2 19 (*)    Hemoglobin 9.9 (*)    HCT 29.0 (*)    All other components within normal limits  I-STAT CG4 LACTIC ACID, ED - Abnormal; Notable for the following components:   Lactic Acid, Venous 10.2 (*)    All other components within normal limits  SARS CORONAVIRUS 2 BY RT PCR  CULTURE, BLOOD (ROUTINE X 2)  CULTURE, BLOOD (ROUTINE X 2)  TRIGLYCERIDES  BLOOD GAS, ARTERIAL  CBC WITH DIFFERENTIAL/PLATELET  COMPREHENSIVE METABOLIC PANEL  URINALYSIS, ROUTINE W REFLEX MICROSCOPIC  RAPID URINE DRUG SCREEN, HOSP PERFORMED  PROTIME-INR  APTT  MAGNESIUM  BETA-HYDROXYBUTYRIC ACID   BRAIN NATRIURETIC PEPTIDE  TROPONIN I (HIGH SENSITIVITY)    EKG EKG Interpretation Date/Time:  Thursday 04/16/23 19:26:08 EDT Ventricular Rate:  87 PR Interval:    QRS Duration:  117 QT Interval:  439 QTC Calculation: 464 R Axis:   -31  Text Interpretation: Atrial fibrillation Incomplete RBBB and LAFB Abnrm T, consider ischemia, anterolateral lds Confirmed by Glyn Ade [phone]) on 03/31/2023 11:59:28 PM  Radiology No results found.  Procedures .Critical Care  Performed by: Loetta Rough, MD Authorized by: Loetta Rough, MD   Critical care provider statement:    Critical care time (minutes):  60   Critical care was necessary to treat or prevent imminent or life-threatening deterioration of the following conditions:  Cardiac failure and CNS failure or compromise   Critical care was time spent personally by me on the following activities:  Development of treatment plan with patient or surrogate, discussions with consultants, evaluation of patient's response to treatment, examination of patient, ordering and review of laboratory studies, ordering and review of radiographic studies, ordering and performing treatments and interventions, pulse oximetry, re-evaluation of patient's condition, review of old charts, obtaining history from patient or surrogate and ventilator management   Care discussed with: admitting provider   Procedure Name: Intubation Date/Time: 04/01/2023 1:31 PM  Performed by: Loetta Rough, MDPre-anesthesia Checklist: Patient identified, Patient being monitored, Emergency Drugs available, Timeout performed and Suction available Oxygen Delivery Method: Ambu bag Preoxygenation: Pre-oxygenation with 100% oxygen (with iGEL by EMS) Induction Type: Rapid sequence Ventilation: Mask ventilation without difficulty Laryngoscope Size: Glidescope and 3 Grade View: Grade I Tube size: 7.5 mm Number of attempts: 1 Airway Equipment and Method:  Video-laryngoscopy Placement Confirmation: ETT inserted through vocal cords under direct vision, CO2 detector and Breath sounds checked- equal and bilateral Secured at: 25 cm Tube secured with: ETT holder Difficulty Due To: Difficult Airway- due to large tongue        Medications Ordered in ED Medications  etomidate (AMIDATE) injection (20 mg Intravenous Given 04-16-2023 1928)  rocuronium (ZEMURON) injection (100 mg Intravenous Given 04/16/2023 1928)  fentaNYL (SUBLIMAZE) injection 50 mcg (50 mcg Intravenous Not Given 04/16/23 2002)  fentaNYL in NS (10mcg/ml) infusion-PREMIX (50 mcg/hr Intravenous New Bag/Given 16-Apr-2023 1942)  fentaNYL (SUBLIMAZE) bolus via infusion 50-100 mcg (has no administration in time range)  propofol (DIPRIVAN) 1000 MG/100ML infusion (30 mcg/kg/min  80 kg (Order-Specific) Intravenous New Bag/Given 16-Apr-2023 2000)    ED Course/ Medical Decision Making/ A&P                          Medical Decision Making Amount and/or Complexity of Data Reviewed Labs: ordered. Decision-making details documented in ED Course. Radiology: ordered.  Risk Prescription drug management. Decision  regarding hospitalization.    This patient presents to the ED for concern of post-arrest, this involves an extensive number of treatment options, and is a complaint that carries with it a high risk of complications and morbidity.  I considered the following differential and admission for this acute, potentially life threatening condition.   MDM:    For causes of cardiac arrest, consider Hs/Ts:  Hypothermia/hyperthermia - patient normothermic Hypovolemia - was at dialysis, was not noted to be hypotensive. Supportive with IVF from EMS.  Hypoxia -  was not in any reported resp distress prior to arrest. Bagged easily w/ iGEL by EMS and obtained definitive airway w/ intubation immediately on arrival to ED w/ 7.5 ETT.  Hypo- or hyperkalemia - No evidence of hyperK on EMS's EKGs,  but will as she was dialysis patient was given calcium IV  H+ (acidosis) - was given bicarb by EMS Tamponade - no h/o CP or SOB, but will evaluate with CXR Tension PTX - She has equal breath sounds bilaterally and will eval w/ CXR Thrombosis (ACS, PE) - Had evidence of inferior STEMI on EMS's EKGs, but these elevations had resolved by the time she arrived to ED. Code STEMI called from field but d/w cardiology prior to patient's arrival who cancelled code STEMI. Will likely consult on patient after she arrives.  Toxins - No report of ingestion   On arrival patient is intubated w/ etomidate/rocuronium without difficulty. Patient is intermittently hypertensive into 190s systolic managed w/ the etomidate and subsequently propofol/fentanyl for post-intubation sedation. She is also hyperglycemic and considered DKA w/ acidosis but labs not c/w this.    Clinical Course as of 04/01/23 1327  Thu Mar 30, 2023  2010 Glucose(!!): 679 [HN]  2010 Hemoglobin(!): 9.9 [HN]  2010 Creatinine(!): 5.00 On dialysis [HN]  2010 Potassium: 3.9 [HN]  2010 Lactic Acid, Venous(!!): 10.2 Post-arrest [HN]  2010 pH, Ven(!!): 7.145 [HN]  2010 pCO2, Ven: 51.4 Not hypercarbic [HN]  2104 UA neg, beta hydroxyburtyrate wnl. WBC 15.1, Hgb 9.6. Repeat VBG shows pH 7.3, pCO2 36.5, improving acid base status but with high oxygen. Will d/w RT.  [HN]  2105 Admitted to critical care [HN]    Clinical Course User Index [HN] Loetta Rough, MD    Labs: I Ordered, and personally interpreted labs.  The pertinent results include:  those listed above  Imaging Studies ordered: I ordered imaging studies including CTH, CT C_spine, CT C/A/P, CXR for tube confirmation I independently visualized and interpreted imaging. I agree with the radiologist interpretation  Additional history obtained from EMS.  Cardiac Monitoring: The patient was maintained on a cardiac monitor.  I personally viewed and interpreted the cardiac monitored  which showed an underlying rhythm of: Rate controlled Afib  Reevaluation: After the interventions noted above, I reevaluated the patient and found that they have :improved  Social Determinants of Health: dialysis patient, otherwise unknown  Disposition:  Admit to ICU with imaging pending, pending cardiology/troponin  Co morbidities that complicate the patient evaluation No past medical history on file.   Medicines Meds ordered this encounter  Medications   etomidate (AMIDATE) injection   rocuronium (ZEMURON) injection   fentaNYL (SUBLIMAZE) injection 50 mcg   fentaNYL in NS (61mcg/ml) infusion-PREMIX   fentaNYL (SUBLIMAZE) bolus via infusion 50-100 mcg   propofol (DIPRIVAN) 1000 MG/100ML infusion    I have reviewed the patients home medicines and have made adjustments as needed  Problem List / ED Course: Problem List Items Addressed This Visit  Cardiovascular and Mediastinum   * (Principal) Cardiac arrest (HCC) - Primary   Relevant Medications   heparin injection 5,000 Units   labetalol (NORMODYNE) injection 10-20 mg   hydrALAZINE (APRESOLINE) injection 10 mg   amLODipine (NORVASC) 10 MG tablet   ELIQUIS 5 MG TABS tablet   ASPIRIN LOW DOSE 81 MG tablet   furosemide (LASIX) 20 MG tablet   rosuvastatin (CRESTOR) 20 MG tablet                This note was created using dictation software, which may contain spelling or grammatical errors.    Loetta Rough, MD 04/01/23 1335

## 2023-03-30 NOTE — Progress Notes (Signed)
Pt transported to CT and back via ventilator without complications RT & RN at bedside.

## 2023-03-30 NOTE — Code Documentation (Signed)
Pt intubated at this time per MD, positive breath sounds and color change noted

## 2023-03-31 ENCOUNTER — Inpatient Hospital Stay (HOSPITAL_COMMUNITY): Payer: Medicaid Other

## 2023-03-31 DIAGNOSIS — R569 Unspecified convulsions: Secondary | ICD-10-CM

## 2023-03-31 DIAGNOSIS — Z992 Dependence on renal dialysis: Secondary | ICD-10-CM | POA: Diagnosis not present

## 2023-03-31 DIAGNOSIS — I469 Cardiac arrest, cause unspecified: Secondary | ICD-10-CM | POA: Diagnosis not present

## 2023-03-31 DIAGNOSIS — N186 End stage renal disease: Secondary | ICD-10-CM

## 2023-03-31 LAB — BLOOD CULTURE ID PANEL (REFLEXED) - BCID2

## 2023-03-31 LAB — GLUCOSE, CAPILLARY
Glucose-Capillary: 494 mg/dL — ABNORMAL HIGH (ref 70–99)
Glucose-Capillary: 532 mg/dL (ref 70–99)
Glucose-Capillary: 573 mg/dL (ref 70–99)
Glucose-Capillary: 459 mg/dL — ABNORMAL HIGH (ref 70–99)
Glucose-Capillary: 462 mg/dL — ABNORMAL HIGH (ref 70–99)
Glucose-Capillary: 352 mg/dL — ABNORMAL HIGH (ref 70–99)
Glucose-Capillary: 464 mg/dL — ABNORMAL HIGH (ref 70–99)
Glucose-Capillary: 374 mg/dL — ABNORMAL HIGH (ref 70–99)
Glucose-Capillary: 20 mg/dL — CL (ref 70–99)
Glucose-Capillary: 324 mg/dL — ABNORMAL HIGH (ref 70–99)
Glucose-Capillary: 309 mg/dL — ABNORMAL HIGH (ref 70–99)
Glucose-Capillary: 272 mg/dL — ABNORMAL HIGH (ref 70–99)
Glucose-Capillary: 280 mg/dL — ABNORMAL HIGH (ref 70–99)
Glucose-Capillary: 237 mg/dL — ABNORMAL HIGH (ref 70–99)
Glucose-Capillary: 219 mg/dL — ABNORMAL HIGH (ref 70–99)
Glucose-Capillary: 215 mg/dL — ABNORMAL HIGH (ref 70–99)
Glucose-Capillary: 212 mg/dL — ABNORMAL HIGH (ref 70–99)
Glucose-Capillary: 234 mg/dL — ABNORMAL HIGH (ref 70–99)
Glucose-Capillary: 230 mg/dL — ABNORMAL HIGH (ref 70–99)
Glucose-Capillary: 285 mg/dL — ABNORMAL HIGH (ref 70–99)
Glucose-Capillary: 260 mg/dL — ABNORMAL HIGH (ref 70–99)

## 2023-03-31 LAB — HEPATIC FUNCTION PANEL
ALT: 198 U/L — ABNORMAL HIGH (ref 0–44)
AST: 283 U/L — ABNORMAL HIGH (ref 15–41)
Albumin: 2.9 g/dL — ABNORMAL LOW (ref 3.5–5.0)
Alkaline Phosphatase: 162 U/L — ABNORMAL HIGH (ref 38–126)
Bilirubin, Direct: 0.2 mg/dL (ref 0.0–0.2)
Indirect Bilirubin: 0.3 mg/dL (ref 0.3–0.9)
Total Bilirubin: 0.5 mg/dL (ref 0.3–1.2)
Total Protein: 6.1 g/dL — ABNORMAL LOW (ref 6.5–8.1)

## 2023-03-31 LAB — TROPONIN I (HIGH SENSITIVITY)
Troponin I (High Sensitivity): 1400 ng/L (ref <18)
Troponin I (High Sensitivity): 2771 ng/L (ref <18)
Troponin I (High Sensitivity): 3453 ng/L (ref <18)
Troponin I (High Sensitivity): 3799 ng/L (ref <18)

## 2023-03-31 LAB — HEMOGLOBIN A1C
Hgb A1c MFr Bld: 8.1 % — ABNORMAL HIGH (ref 4.8–5.6)
Mean Plasma Glucose: 185.77 mg/dL

## 2023-03-31 LAB — BASIC METABOLIC PANEL
Anion gap: 20 — ABNORMAL HIGH (ref 5–15)
BUN: 51 mg/dL — ABNORMAL HIGH (ref 6–20)
CO2: 15 mmol/L — ABNORMAL LOW (ref 22–32)
Calcium: 9.1 mg/dL (ref 8.9–10.3)
Chloride: 99 mmol/L (ref 98–111)
Creatinine, Ser: 4.84 mg/dL — ABNORMAL HIGH (ref 0.44–1.00)
GFR, Estimated: 10 mL/min — ABNORMAL LOW (ref >60)
Glucose, Bld: 467 mg/dL — ABNORMAL HIGH (ref 70–99)
Potassium: 3.7 mmol/L (ref 3.5–5.1)
Sodium: 134 mmol/L — ABNORMAL LOW (ref 135–145)

## 2023-03-31 LAB — ECHOCARDIOGRAM COMPLETE
Area-P 1/2: 3.3 cm2
Calc EF: 36.5 %
Height: 67 in
S' Lateral: 4 cm
Single Plane A2C EF: 34.2 %
Single Plane A4C EF: 37.9 %
Weight: 2363.33 [oz_av]

## 2023-03-31 LAB — PHOSPHORUS: Phosphorus: 4.1 mg/dL (ref 2.5–4.6)

## 2023-03-31 LAB — SARS CORONAVIRUS 2 BY RT PCR: SARS Coronavirus 2 by RT PCR: NEGATIVE

## 2023-03-31 LAB — TRIGLYCERIDES: Triglycerides: 881 mg/dL — ABNORMAL HIGH (ref <150)

## 2023-03-31 LAB — CG4 I-STAT (LACTIC ACID): Lactic Acid, Venous: 4.1 mmol/L (ref 0.5–1.9)

## 2023-03-31 LAB — BETA-HYDROXYBUTYRIC ACID: Beta-Hydroxybutyric Acid: 0.25 mmol/L (ref 0.05–0.27)

## 2023-03-31 LAB — LACTIC ACID, PLASMA
Lactic Acid, Venous: 4.9 mmol/L (ref 0.5–1.9)
Lactic Acid, Venous: 2.9 mmol/L (ref 0.5–1.9)

## 2023-03-31 LAB — HIV ANTIBODY (ROUTINE TESTING W REFLEX): HIV Screen 4th Generation wRfx: NONREACTIVE

## 2023-03-31 LAB — MRSA NEXT GEN BY PCR, NASAL: MRSA by PCR Next Gen: NOT DETECTED

## 2023-03-31 LAB — MAGNESIUM
Magnesium: 1.8 mg/dL (ref 1.7–2.4)
Magnesium: 2.8 mg/dL — ABNORMAL HIGH (ref 1.7–2.4)

## 2023-03-31 MED ORDER — ORAL CARE MOUTH RINSE
15.0000 mL | OROMUCOSAL | Status: DC
  Administered 2023-03-31 – 2023-04-05 (×66): 15 mL via OROMUCOSAL

## 2023-03-31 MED ORDER — LEVETIRACETAM IN NACL 1500 MG/100ML IV SOLN
1500.0000 mg | Freq: Once | INTRAVENOUS | Status: AC
  Administered 2023-03-31: 1500 mg via INTRAVENOUS
  Filled 2023-03-31: qty 100

## 2023-03-31 MED ORDER — RENA-VITE PO TABS
1.0000 | ORAL_TABLET | Freq: Every day | ORAL | Status: DC
  Administered 2023-03-31 – 2023-04-04 (×5): 1
  Filled 2023-03-31 (×5): qty 1

## 2023-03-31 MED ORDER — ORAL CARE MOUTH RINSE: 15.0000 mL | OROMUCOSAL | Status: DC | PRN

## 2023-03-31 MED ORDER — MAGNESIUM SULFATE 2 GM/50ML IV SOLN
2.0000 g | Freq: Once | INTRAVENOUS | Status: AC
  Administered 2023-03-31: 2 g via INTRAVENOUS
  Filled 2023-03-31: qty 50

## 2023-03-31 MED ORDER — HYDRALAZINE HCL 20 MG/ML IJ SOLN
10.0000 mg | INTRAMUSCULAR | Status: DC | PRN
  Administered 2023-03-31 – 2023-04-03 (×13): 10 mg via INTRAVENOUS
  Filled 2023-03-31 (×11): qty 1

## 2023-03-31 MED ORDER — VITAL 1.5 CAL PO LIQD
1000.0000 mL | ORAL | Status: DC
  Administered 2023-03-31 – 2023-04-04 (×7): 1000 mL

## 2023-03-31 MED ORDER — PROSOURCE TF20 ENFIT COMPATIBL EN LIQD
60.0000 mL | Freq: Two times a day (BID) | ENTERAL | Status: DC
  Administered 2023-03-31 – 2023-04-05 (×10): 60 mL
  Filled 2023-03-31 (×9): qty 60

## 2023-03-31 MED ORDER — CHLORHEXIDINE GLUCONATE CLOTH 2 % EX PADS
6.0000 | MEDICATED_PAD | Freq: Every day | CUTANEOUS | Status: DC
  Administered 2023-04-01 – 2023-04-05 (×6): 6 via TOPICAL

## 2023-03-31 MED ORDER — POTASSIUM CHLORIDE 20 MEQ PO PACK
40.0000 meq | PACK | Freq: Once | ORAL | Status: AC
  Administered 2023-03-31: 40 meq
  Filled 2023-03-31: qty 2

## 2023-03-31 MED ORDER — LEVETIRACETAM IN NACL 500 MG/100ML IV SOLN
500.0000 mg | Freq: Two times a day (BID) | INTRAVENOUS | Status: DC
  Administered 2023-04-01 – 2023-04-05 (×10): 500 mg via INTRAVENOUS
  Filled 2023-03-31 (×10): qty 100

## 2023-03-31 NOTE — Procedures (Signed)
Patient Name: Angela Merritt  MRN: [id number]  Epilepsy Attending: Charlsie Quest  Referring Physician/Provider: Lynnell Catalan, MD Date: 03/31/2023 Duration: 23.45 mins  Patient history: 57yo M s/p cardiac arrest getting eeg to evaluate for seizure  Level of alertness: comatose  AEDs during EEG study: Propofol  Technical aspects: This EEG study was done with scalp electrodes positioned according to the 10-20 International system of electrode placement. Electrical activity was reviewed with band pass filter of 1-70Hz , sensitivity of 7 uV/mm, display speed of 57mm/sec with a 60Hz  notched filter applied as appropriate. EEG data were recorded continuously and digitally stored.  Video monitoring was available and reviewed as appropriate.  Description: EEG showed continuous generalized background suppression, not reactive to stimulation. Hyperventilation and photic stimulation were not performed.     EKG artifact was seen during the study.  ABNORMALITY - Background suppression, generalized  IMPRESSION: This study is suggestive of profound diffuse encephalopathy. No seizures or epileptiform discharges were seen throughout the recording.  [name] [name]

## 2023-03-31 NOTE — Progress Notes (Signed)
eLink Physician-Brief Progress Note Patient Name: Angela Merritt DOB: 03/24/1966 MRN: [id number]   Date of Service  03/31/2023  HPI/Events of Note  Pt SBP goal < 140, after getting PRN Hydralazine 10 Q4 and Labetolol 10-20 PRN Q2 and BP is still 140-150. Orders to notify if RR > 20-30 on vent.  Witnessed VF cardiac arrest with prolonged CPR. 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   Camera: Discussed with RN.on Vent, TTM- normothermia. VS stable. Amputee.   eICU Interventions  Continue current care plan. SBP 144.  Call us if SBP > 160.  LA normalizing.  BMP/mag  for HHS, on insulin drip ordered.       Intervention Category Intermediate Interventions: Hypertension - evaluation and management  Ranee Gosselin 03/31/2023, 11:44 PM

## 2023-03-31 NOTE — Progress Notes (Signed)
Initial Nutrition Assessment  DOCUMENTATION CODES:   Not applicable  INTERVENTION:   Tube Feeding via OG:  Vital 1.5 at 40 ml/hr Begin TF at rate of 20 ml/hr, titrate by 10 mL q 8 hours until goal rate of 40 ml/hr Pro-source TF20 60 mL BID Tf at goal rate provides 1600 kcals, 105 g of protein and 730 mL of free water  Add Renal MVI daily  NUTRITION DIAGNOSIS:   Inadequate oral intake related to acute illness as evidenced by NPO status.  GOAL:   Patient will meet greater than or equal to 90% of their needs  MONITOR:   Vent status, Labs, Weight trends, TF tolerance  REASON FOR ASSESSMENT:   Ventilator    ASSESSMENT:   57 yo female admitted post cardiac arrest. PMH includes ESRD on iHD, HTN, DM, uses wheel chair, LLE amputation  10/10 Admitted post cardiac arrest, Intubated, TTM  Pt remains on vent support, TTM (normothermia) ?seizure activity, neurology consulted, plan for cEEG Propofol: currently off  OG tube in stomach per abd xray  CBGs >600 on admission, on insulin gtt and trending down with last CBG 237  Nephrology consult but no urgent needs for iHD at this time  Labs: sodium 134 (L), potassium 3.7 (wdl), magnesium 1.8 (wdl), no phosphorus-plan to check  Meds: miralax, mag sulfate, KCl, insulin gtt, colace   NUTRITION - FOCUSED PHYSICAL EXAM:  Unable to assess  Diet Order:   Diet Order     None       EDUCATION NEEDS:   Not appropriate for education at this time  Skin:  Skin Assessment: Reviewed RN Assessment  Last BM:  10/10 via FMS  Height:   Ht Readings from Last 1 Encounters:  03/31/23 5\' 7"  (1.702 m)    Weight:   Wt Readings from Last 1 Encounters:  03/31/23 67 kg      BMI:  Body mass index is 23.13 kg/m.  Estimated Nutritional Needs:   Kcal:  1600-1800 kcals  Protein:  95-115 g  Fluid:  1L plus UOP    Romelle Starcher MS, RDN, LDN, CNSC Registered Dietitian 3 Clinical Nutrition RD Pager and On-Call Pager Number  Located in [place]

## 2023-03-31 NOTE — Procedures (Signed)
Arterial Line Insertion Start/End10/04/2023 12:45 AM, 03/31/2023 12:59 AM  Patient location: ICU. Preanesthetic checklist: patient identified, IV checked, site marked, risks and benefits discussed, surgical consent, monitors and equipment checked, pre-op evaluation and timeout performed Right, radial was placed Catheter size: 20 G Hand hygiene performed , maximum sterile barriers used  and Seldinger technique used Allen's test indicative of satisfactory collateral circulation Attempts: 2 Procedure performed without using ultrasound guided technique. Following insertion, dressing applied and Biopatch. Post procedure assessment: normal  Patient tolerated the procedure well with no immediate complications.

## 2023-03-31 NOTE — Progress Notes (Signed)
  Echocardiogram 2D Echocardiogram has been performed.  Leda Roys RDCS 03/31/2023, 10:43 AM

## 2023-03-31 NOTE — Progress Notes (Signed)
PHARMACY - PHYSICIAN COMMUNICATION CRITICAL VALUE ALERT - BLOOD CULTURE IDENTIFICATION (BCID)  Angela Merritt is an 57 y.o. female who presented to [facility] on 03/27/2023 with a chief complaint of cardiac arrest with bystander CPR s/p ROSC x2   Assessment: Bcx 1 of 2 bottles growing staph species, not aureus, no resistance. WBC elevated and lactate elevated but trending down likely due to down time.   Name of physician (or Provider) Contacted: Dr. Denese Killings.   Current antibiotics: none  Changes to prescribed antibiotics recommended:  No changes   No results found for this or any previous visit.   Angela Merritt, PharmD, BCPS, BCCP Clinical Pharmacist  Please check AMION for all [facility] Pharm[facility] phone numbers After 10:00 PM, call Main Pharmacy [phone]

## 2023-03-31 NOTE — Progress Notes (Signed)
LTM EEG running with CT wires. [facility] is monitoring.

## 2023-03-31 NOTE — Progress Notes (Addendum)
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  Not on continuous sedation.   P:  Load w/keppra and cont BID cEEG overnight, consider Neurology consult Restart propofol if needed, prefer over versed given short-acting Nephrology consulted, no urgent HD needs at this time        Posey Boyer, MSN, AG-ACNP-BC [facility] 03/31/2023, 3:45 PM  See Amion for pager If no response to pager , please call [phone] until 7pm After 7:00 pm call Elink  [phone]

## 2023-03-31 NOTE — Progress Notes (Signed)
EEG complete - results pending 

## 2023-03-31 NOTE — H&P (Signed)
NAME:  Angela Merritt, MRN:  [id number], DOB:  05-03-1966, LOS: 1 ADMISSION DATE:  03/30/2023, CONSULTATION DATE:  03/31/2023  REFERRING MD:  Jearld Fenton, EDP , CHIEF COMPLAINT: Cardiac arrest  History of Present Illness:  57 year old dialysis patient who presents postcardiac arrest.  History obtained after speaking to EDP and reviewing medical record. She completed her session of dialysis on Wednesday.  She collapsed collapsed on Thursday at home, but did not immediately receive bystander CPR.  Shock administer by GFD, ROSC obtained after 8 minutes of CPR, then lost pulses again with additional 15 minutes of CPR, shocked x 3, 4 doses of epi.  Had spontaneous breathing, required family grams of Versed and 100 mcg of IV fentanyl and route due to gagging, required transcutaneous pacing for heart rate in the 40s.  Code STEMI called to the EKG showing elevations in inferior leads and and diffuse depressions.  Intubated after arrival to ED, placed on propofol and fentanyl drips.  Code STEMI called off EKG shows incomplete RBBB pattern with atrial fibrillation  Per aunt, her date of birth is February 27, 1966  Pertinent  Medical History  ESRD on HD M/W/F-left arm AV fistula HTN DM-2 Wheelchair  Significant Hospital Events: Including procedures, antibiotic start and stop dates in addition to other pertinent events   10/10 admitted following cardiac arrest.  Interim History / Subjective:  On normothermic targeted temperature management.  Objective   Blood pressure (!) 186/56, pulse 74, temperature 97.7 F (36.5 C), temperature source Oral, resp. rate (!) 25, height 5\' 7"  (1.702 m), weight 67 kg, SpO2 99%.    Vent Mode: PRVC FiO2 (%):  [40 %-60 %] 40 % Set Rate:  [20 bmp-26 bmp] 20 bmp Vt Set:  [490 mL-520 mL] 490 mL PEEP:  [5 cmH20] 5 cmH20 Pressure Support:  [12 cmH20] 12 cmH20 Plateau Pressure:  [20 cmH20] 20 cmH20   Intake/Output Summary (Last 24 hours) at 03/31/2023 0951 Last data filed at  03/31/2023 0900 Gross per 24 hour  Intake 240.01 ml  Output 944 ml  Net -703.99 ml   Filed Weights   03/30/23 2245 03/31/23 0500 03/31/23 0626  Weight: 65.8 kg 65.8 kg 67 kg    Examination: General: Obese woman orally intubated. HENT: ET tube OG tube in place Lungs: Clear to auscultation bilaterally Cardiovascular: S1-S2 irregular, distant extremities warm.  Patient hypertensive. Abdomen: Soft, nontender Extremities: LLE amputee, left arm AV fistula thrill.  TTM pads in place.  Patient actively being cooled. Neuro: No response to painful stimulation.  Pupils equal and reactive sluggishly.  Ancillary tests personally reviewed  Creatinine of 4.84 Hyperglycemic with blood sugars above 200, HbA1c 8.1 Mild transaminitis Troponin 2771, BNP 271 Lactic acid 4.9 WBC 15.1 Hemoglobin 10.5 Negative initial head CT. Assessment & Plan:   Witnessed VF cardiac arrest with prolonged CPR. 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 History of hypertension History of left lower extremity amputation  Plan:  -Continue TTM normothermia.  Review EEG from last night.  Keep off sedation if possible.  Follow serial neurological examination.  Delay final neuro prognostication until 96 hours.  May obtain noncontrasted MRI at that time.  Gnosis guarded given prolonged arrest time and comorbidities. -Optimize glycemic control using IV insulin with Endo tool.  Improved glycemia may also help brain recovery. -Continue full ventilator support. -Restart enteral BP medications once med review complete.  Use PRNs for now.   Best Practice (right click and "Reselect  all SmartList Selections" daily)   Diet/type: NPO w/ meds via tube DVT prophylaxis: prophylactic heparin  GI prophylaxis: H2B Lines: N/A Foley:  N/A Code Status:  full code Last date of multidisciplinary goals of care discussion  [NA] updated aunt [name]  CRITICAL CARE Performed by: Lynnell Catalan   Total critical care time: 40 minutes  Critical care time was exclusive of separately billable procedures and treating other patients.  Critical care was necessary to treat or prevent imminent or life-threatening deterioration.  Critical care was time spent personally by me on the following activities: development of treatment plan with patient and/or surrogate as well as nursing, discussions with consultants, evaluation of patient's response to treatment, examination of patient, obtaining history from patient or surrogate, ordering and performing treatments and interventions, ordering and review of laboratory studies, ordering and review of radiographic studies, pulse oximetry, re-evaluation of patient's condition and participation in multidisciplinary rounds.  Lynnell Catalan, MD [facility] ICU Physician [facility]  Pager: [phone] Mobile: [phone] After hours: [phone].

## 2023-04-01 ENCOUNTER — Encounter (HOSPITAL_COMMUNITY): Payer: Self-pay | Admitting: Pulmonary Disease

## 2023-04-01 DIAGNOSIS — I469 Cardiac arrest, cause unspecified: Secondary | ICD-10-CM | POA: Diagnosis not present

## 2023-04-01 DIAGNOSIS — R569 Unspecified convulsions: Secondary | ICD-10-CM | POA: Diagnosis not present

## 2023-04-01 LAB — BASIC METABOLIC PANEL
Anion gap: 17 — ABNORMAL HIGH (ref 5–15)
BUN: 70 mg/dL — ABNORMAL HIGH (ref 6–20)
CO2: 16 mmol/L — ABNORMAL LOW (ref 22–32)
Calcium: 8.7 mg/dL — ABNORMAL LOW (ref 8.9–10.3)
Chloride: 105 mmol/L (ref 98–111)
Creatinine, Ser: 5.5 mg/dL — ABNORMAL HIGH (ref 0.44–1.00)
GFR, Estimated: 8 mL/min — ABNORMAL LOW (ref >60)
Glucose, Bld: 310 mg/dL — ABNORMAL HIGH (ref 70–99)
Potassium: 4.5 mmol/L (ref 3.5–5.1)
Sodium: 138 mmol/L (ref 135–145)

## 2023-04-01 LAB — CBC
HCT: 27.6 % — ABNORMAL LOW (ref 36.0–46.0)
Hemoglobin: 9.2 g/dL — ABNORMAL LOW (ref 12.0–15.0)
MCH: 32.7 pg (ref 26.0–34.0)
MCHC: 33.3 g/dL (ref 30.0–36.0)
MCV: 98.2 fL (ref 80.0–100.0)
Platelets: 282 10*3/uL (ref 150–400)
RBC: 2.81 MIL/uL — ABNORMAL LOW (ref 3.87–5.11)
RDW: 17.4 % — ABNORMAL HIGH (ref 11.5–15.5)
WBC: 11.4 10*3/uL — ABNORMAL HIGH (ref 4.0–10.5)
nRBC: 0.2 % (ref 0.0–0.2)

## 2023-04-01 LAB — GLUCOSE, CAPILLARY
Glucose-Capillary: 193 mg/dL — ABNORMAL HIGH (ref 70–99)
Glucose-Capillary: 206 mg/dL — ABNORMAL HIGH (ref 70–99)
Glucose-Capillary: 206 mg/dL — ABNORMAL HIGH (ref 70–99)
Glucose-Capillary: 208 mg/dL — ABNORMAL HIGH (ref 70–99)
Glucose-Capillary: 222 mg/dL — ABNORMAL HIGH (ref 70–99)
Glucose-Capillary: 230 mg/dL — ABNORMAL HIGH (ref 70–99)
Glucose-Capillary: 241 mg/dL — ABNORMAL HIGH (ref 70–99)
Glucose-Capillary: 245 mg/dL — ABNORMAL HIGH (ref 70–99)
Glucose-Capillary: 270 mg/dL — ABNORMAL HIGH (ref 70–99)
Glucose-Capillary: 276 mg/dL — ABNORMAL HIGH (ref 70–99)
Glucose-Capillary: 276 mg/dL — ABNORMAL HIGH (ref 70–99)
Glucose-Capillary: 278 mg/dL — ABNORMAL HIGH (ref 70–99)
Glucose-Capillary: 280 mg/dL — ABNORMAL HIGH (ref 70–99)
Glucose-Capillary: 280 mg/dL — ABNORMAL HIGH (ref 70–99)
Glucose-Capillary: 298 mg/dL — ABNORMAL HIGH (ref 70–99)
Glucose-Capillary: 298 mg/dL — ABNORMAL HIGH (ref 70–99)
Glucose-Capillary: 299 mg/dL — ABNORMAL HIGH (ref 70–99)
Glucose-Capillary: 300 mg/dL — ABNORMAL HIGH (ref 70–99)
Glucose-Capillary: 307 mg/dL — ABNORMAL HIGH (ref 70–99)
Glucose-Capillary: 316 mg/dL — ABNORMAL HIGH (ref 70–99)
Glucose-Capillary: 333 mg/dL — ABNORMAL HIGH (ref 70–99)
Glucose-Capillary: 341 mg/dL — ABNORMAL HIGH (ref 70–99)

## 2023-04-01 LAB — COMPREHENSIVE METABOLIC PANEL
ALT: 128 U/L — ABNORMAL HIGH (ref 0–44)
AST: 100 U/L — ABNORMAL HIGH (ref 15–41)
Albumin: 2.6 g/dL — ABNORMAL LOW (ref 3.5–5.0)
Alkaline Phosphatase: 124 U/L (ref 38–126)
Anion gap: 17 — ABNORMAL HIGH (ref 5–15)
BUN: 74 mg/dL — ABNORMAL HIGH (ref 6–20)
CO2: 16 mmol/L — ABNORMAL LOW (ref 22–32)
Calcium: 8.7 mg/dL — ABNORMAL LOW (ref 8.9–10.3)
Chloride: 104 mmol/L (ref 98–111)
Creatinine, Ser: 5.79 mg/dL — ABNORMAL HIGH (ref 0.44–1.00)
GFR, Estimated: 8 mL/min — ABNORMAL LOW (ref >60)
Glucose, Bld: 302 mg/dL — ABNORMAL HIGH (ref 70–99)
Potassium: 4.1 mmol/L (ref 3.5–5.1)
Sodium: 137 mmol/L (ref 135–145)
Total Bilirubin: 0.4 mg/dL (ref 0.3–1.2)
Total Protein: 5.9 g/dL — ABNORMAL LOW (ref 6.5–8.1)

## 2023-04-01 LAB — POCT I-STAT 7, (LYTES, BLD GAS, ICA,H+H)
Acid-base deficit: 9 mmol/L — ABNORMAL HIGH (ref 0.0–2.0)
Bicarbonate: 15.4 mmol/L — ABNORMAL LOW (ref 20.0–28.0)
Calcium, Ion: 1.1 mmol/L — ABNORMAL LOW (ref 1.15–1.40)
HCT: 27 % — ABNORMAL LOW (ref 36.0–46.0)
Hemoglobin: 9.2 g/dL — ABNORMAL LOW (ref 12.0–15.0)
O2 Saturation: 100 %
Patient temperature: 98.8
Potassium: 3.9 mmol/L (ref 3.5–5.1)
Sodium: 137 mmol/L (ref 135–145)
TCO2: 16 mmol/L — ABNORMAL LOW (ref 22–32)
pCO2 arterial: 26.5 mm[Hg] — ABNORMAL LOW (ref 32–48)
pH, Arterial: 7.374 (ref 7.35–7.45)
pO2, Arterial: 204 mm[Hg] — ABNORMAL HIGH (ref 83–108)

## 2023-04-01 LAB — CG4 I-STAT (LACTIC ACID): Lactic Acid, Venous: 1.6 mmol/L (ref 0.5–1.9)

## 2023-04-01 LAB — PHOSPHORUS
Phosphorus: 5.2 mg/dL — ABNORMAL HIGH (ref 2.5–4.6)
Phosphorus: 4.6 mg/dL (ref 2.5–4.6)

## 2023-04-01 LAB — MAGNESIUM
Magnesium: 2.7 mg/dL — ABNORMAL HIGH (ref 1.7–2.4)
Magnesium: 2.8 mg/dL — ABNORMAL HIGH (ref 1.7–2.4)
Magnesium: 2.9 mg/dL — ABNORMAL HIGH (ref 1.7–2.4)

## 2023-04-01 LAB — HEPATITIS B SURFACE ANTIGEN: Hepatitis B Surface Ag: NONREACTIVE

## 2023-04-01 MED ORDER — AMLODIPINE BESYLATE 10 MG PO TABS
10.0000 mg | ORAL_TABLET | Freq: Every day | ORAL | Status: DC
  Administered 2023-04-01 – 2023-04-05 (×5): 10 mg
  Filled 2023-04-01 (×5): qty 1

## 2023-04-01 MED ORDER — CHLORHEXIDINE GLUCONATE CLOTH 2 % EX PADS
6.0000 | MEDICATED_PAD | Freq: Every day | CUTANEOUS | Status: DC
  Administered 2023-04-02 – 2023-04-05 (×4): 6 via TOPICAL

## 2023-04-01 MED ORDER — ACETAMINOPHEN 325 MG PO TABS: 650.0000 mg | ORAL_TABLET | ORAL | Status: DC

## 2023-04-01 MED ORDER — HYDRALAZINE HCL 10 MG PO TABS
10.0000 mg | ORAL_TABLET | Freq: Three times a day (TID) | ORAL | Status: DC
  Administered 2023-04-01 – 2023-04-03 (×5): 10 mg
  Filled 2023-04-01 (×5): qty 1

## 2023-04-01 MED ORDER — ACETAMINOPHEN 160 MG/5ML PO SOLN: 650.0000 mg | ORAL | Status: DC

## 2023-04-01 MED ORDER — ACETAMINOPHEN 650 MG RE SUPP: 650.0000 mg | RECTAL | Status: DC

## 2023-04-01 NOTE — H&P (Signed)
NAME:  Marlayne Rutten, MRN:  [id number], DOB:  04-05-1966, LOS: 2 ADMISSION DATE:  Apr 19, 2023, CONSULTATION DATE:  04/01/2023  REFERRING MD:  Jearld Fenton, EDP , CHIEF COMPLAINT: Cardiac arrest  History of Present Illness:  57 year old dialysis patient who presents postcardiac arrest.  History obtained after speaking to EDP and reviewing medical record. She completed her session of dialysis on Wednesday.  She collapsed collapsed on Thursday at home, but did not immediately receive bystander CPR.  Shock administer by GFD, ROSC obtained after 8 minutes of CPR, then lost pulses again with additional 15 minutes of CPR, shocked x 3, 4 doses of epi.  Had spontaneous breathing, required family grams of Versed and 100 mcg of IV fentanyl and route due to gagging, required transcutaneous pacing for heart rate in the 40s.  Code STEMI called to the EKG showing elevations in inferior leads and and diffuse depressions.  Intubated after arrival to ED, placed on propofol and fentanyl drips.  Code STEMI called off EKG shows incomplete RBBB pattern with atrial fibrillation  Per aunt, her date of birth is June 15, 1966  Pertinent  Medical History  ESRD on HD M/W/F-left arm AV fistula HTN DM-2 Wheelchair  Significant Hospital Events: Including procedures, antibiotic start and stop dates in addition to other pertinent events   10/10 admitted following cardiac arrest. 10/12 remains unresponsive off sedation.  Interim History / Subjective:  On normothermic targeted temperature management.  No response to painful stimulation.  Objective   Blood pressure (!) 162/51, pulse 91, temperature 98.7 F (37.1 C), resp. rate (!) 37, height 5\' 7"  (1.702 m), weight 66.7 kg, SpO2 100%.    Vent Mode: PRVC FiO2 (%):  [40 %] 40 % Set Rate:  [20 bmp] 20 bmp Vt Set:  [490 mL] 490 mL PEEP:  [5 cmH20] 5 cmH20 Plateau Pressure:  [17 cmH20-22 cmH20] 21 cmH20   Intake/Output Summary (Last 24 hours) at 04/01/2023 1536 Last data filed  at 04/01/2023 1300 Gross per 24 hour  Intake 1123.2 ml  Output 506 ml  Net 617.2 ml   Filed Weights   03/31/23 0500 03/31/23 0626 04/01/23 0451  Weight: 65.8 kg 67 kg 66.7 kg    Examination: General: Obese woman orally intubated. HENT: ET tube OG tube in place Lungs: Clear to auscultation bilaterally Cardiovascular: S1-S2 irregular, distant extremities warm.  Patient hypertensive. Abdomen: Soft, nontender Extremities: LLE amputee, left arm AV fistula thrill.  TTM pads in place.  Patient actively being cooled. Neuro: No response to painful stimulation.  Pupils equal and reactive sluggishly.  Ancillary tests personally reviewed  Creatinine of 5.79 Hyperglycemic with blood sugars above 200, HbA1c 8.1 Mild transaminitis improving Troponin 2771, BNP 271 Lactic acid 2.9 WBC 11.4 Hemoglobin 9.2 Negative initial head CT. Assessment & Plan:   Witnessed VF cardiac arrest with prolonged CPR. 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 History of hypertension History of left lower extremity amputation  Plan:  -Discontinue active normothermia.  Keep off sedation  Follow serial neurological examination.  Delay final neuro prognostication until 96 hours.  May obtain noncontrasted MRI at that time.  Prognosis guarded given prolonged arrest time and comorbidities. -Optimize glycemic control using IV insulin with Endo tool.  Improved glycemia may also help brain recovery.  Still on high infusion rate.  Not ready to transition. -Continue full ventilator support. -Start hydralazine and amlodipine for blood pressure control. -For IHD today.   Best Practice (right click and "  Reselect all SmartList Selections" daily)   Diet/type: NPO w/ meds via tube on tube feeds DVT prophylaxis: prophylactic heparin  GI prophylaxis: H2B Lines: N/A Foley:  N/A Code Status:  full code Last  date of multidisciplinary goals of care discussion [NA] updated aunt [name]  CRITICAL CARE Performed by: Lynnell Catalan   Total critical care time: 35 minutes  Critical care time was exclusive of separately billable procedures and treating other patients.  Critical care was necessary to treat or prevent imminent or life-threatening deterioration.  Critical care was time spent personally by me on the following activities: development of treatment plan with patient and/or surrogate as well as nursing, discussions with consultants, evaluation of patient's response to treatment, examination of patient, obtaining history from patient or surrogate, ordering and performing treatments and interventions, ordering and review of laboratory studies, ordering and review of radiographic studies, pulse oximetry, re-evaluation of patient's condition and participation in multidisciplinary rounds.  Lynnell Catalan, MD [facility] ICU Physician [facility] [place] [facility]  Pager: [phone] Mobile: [phone] After hours: [phone].

## 2023-04-01 NOTE — Procedures (Signed)
Patient Name: Milagros Middendorf  MRN: [id number]  Epilepsy Attending: Charlsie Quest  Referring Physician/Provider: Norton Blizzard, NP  Duration: 03/31/2023 1609 to 04/01/2023 1609  Patient history:  57yo M s/p cardiac arrest getting eeg to evaluate for seizure   Level of alertness: comatose  AEDs during EEG study: LEV  Technical aspects: This EEG study was done with scalp electrodes positioned according to the 10-20 International system of electrode placement. Electrical activity was reviewed with band pass filter of 1-70Hz , sensitivity of 7 uV/mm, display speed of 37mm/sec with a 60Hz  notched filter applied as appropriate. EEG data were recorded continuously and digitally stored.  Video monitoring was available and reviewed as appropriate.  Description:  EEG showed continuous generalized background suppression, not reactive to stimulation. Hyperventilation and photic stimulation were not performed.      ABNORMALITY - Background suppression, generalized   IMPRESSION: This study is suggestive of profound diffuse encephalopathy. No seizures or epileptiform discharges were seen throughout the recording.   Kenyette Gundy [name]

## 2023-04-01 NOTE — Consult Note (Signed)
Renal Service Consult Note [facility]  Angela Merritt 04/01/2023 Angela Krabbe, MD Requesting Physician: Dr. Denese Killings  Reason for Consult: ESRD pt s/p cardiac arrest HPI: The patient is a 57 y.o. year-old w/ PMH of esrd on HD, DM2, HTN, PE, WC dependent, PAD, L BKA who presented post -cardiac arrest. Pt had her usual HD Wed. On Thursday pt collapsed at home. Family went to get the neighbors, who then called 911. Shock given by fire, ROSC after 8 min CPR then lost pulse for another 15 min CPR. Intubated in the ED and sedated. Pt admitted to ICU. Cooling protocol was initiated. We are asked to see for dialysis.   Pt seen in ICU, pt sedated on the vent, no hx obtained.   ROS - n/a   Past Medical History  Past Medical History:  Diagnosis Date   Acute respiratory failure due to COVID-19 (HCC) 11/2022   Anemia    Asthma    Chronic kidney disease    Current every day smoker    Diabetic retinopathy (HCC)    DVT (deep venous thrombosis) (HCC)    ESRD (end stage renal disease) on dialysis (HCC)    Hyperlipidemia    Hypertension    Neuropathy    Peripheral vascular disease (HCC)    Pulmonary edema    Pulmonary embolism (HCC)    Type 2 diabetes mellitus (HCC)    Wheelchair dependence    Past Surgical History  Family History No family history on file. Social History  reports that she has been smoking cigarettes. She started smoking about 39 years ago. She has a 19.5 pack-year smoking history. She has never used smokeless tobacco. She reports current alcohol use of about 2.0 standard drinks of alcohol per week. No history on file for drug use. Allergies Not on File Home medications Prior to Admission medications   Medication Sig Start Date End Date Taking? Authorizing Provider  amLODipine (NORVASC) 10 MG tablet Take 10 mg by mouth daily. 03/26/23   [provider]  ASPIRIN LOW DOSE 81 MG tablet Take 81 mg by mouth daily. 10/31/22   [provider]   ELIQUIS 5 MG TABS tablet Take 5 mg by mouth 2 (two) times daily. 03/26/23   [provider]  furosemide (LASIX) 20 MG tablet Take 20 mg by mouth daily. 10/31/22   [provider]  gabapentin (NEURONTIN) 100 MG capsule Take 100 mg by mouth 3 (three) times daily. 03/26/23   [provider]  insulin glargine (LANTUS) 100 UNIT/ML injection Inject 24 Units into the skin daily. 03/29/23   [provider]  oxyCODONE (OXY IR/ROXICODONE) 5 MG immediate release tablet Take by mouth. 10/31/22   [provider]  OZEMPIC, 0.25 OR 0.5 MG/DOSE, 2 MG/3ML SOPN Inject 0.5 mg into the skin once a week. 12/27/22   [provider]  RENVELA 0.8 g PACK packet Take by mouth as directed. 12/26/22   [provider]  rosuvastatin (CRESTOR) 20 MG tablet Take 20 mg by mouth daily. 03/26/23   [provider]  traMADol HCl 25 MG TABS Take by mouth. 11/09/22   [provider]     Vitals:   04/01/23 1046 04/01/23 1100 04/01/23 1115 04/01/23 1130  BP:      Pulse: 91 92 93 95  Resp: (!) 34 (!) 34 (!) 34 (!) 35  Temp:      TempSrc:      SpO2:  100% 100% 100%  Weight:  Height:       Exam Gen on vent, sedated Cooling pads in place No rash, cyanosis or gangrene Sclera anicteric, throat w/ ETT No jvd or bruits Chest clear anterior/ lateral RRR no MRG Abd soft ntnd no mass or ascites +bs GU normal MS L BKA Ext  no sig UE or LE edema, no wounds or ulcers Neuro is on vent, sedated   L AVF +bruit     Renal-related home meds: - norvasc 10 - eliquis 50 bid - lasix 20 every day - gabapentin 100 tid - renvela ? Dose - others: oxyIR, tramadol, crestor, ozempic, insulin, asa    OP HD: [facility] [address]  MWF  3.5h    53kg   300/500   LUA AVF   Heparin 2600 + 500u/hr - EPO 8400 q hd  - last HD 10/09    CXR 10/10 - no edema no congestion   VS - 155/ 50,  HR 95, RR 31- 40 , vent 0.40     Na 137 K 4.1  CO2 16   AV 17  BUN 74    creat 5.79   Ca 8.7    Alb 2.6   AST 100  ALT 128  tbili 0.4   WBC 15K --> 11K    BNP 271   trop 71 -> 2770 --> 3450  Assessment/ Plan: VF cardiac arrest - out of hospital w/ prolonged CPR. On cooling protocol and IV keppra for myoclonic seizures.  Acute hypoxic resp failure - on vent support, CXR clear DM2 w/ uncontrolled hyperglycemia  ESRD - on HD MWF. Last HD 10/09. HD tonight in ICU.  HTN/ BP - BP's high. Getting IV BP lowering medications.  Volume - up by wts, not sure accurate. Exam w/o edema and CXR clear. UF 3-3.5 L as tolerated.  Anemia esrd - Hb 10- 11 here. Get's EPO at OP unit . Will ask pharm for assist w/ conversion.  MBD ckd - CCa and phos are in range. No vdra or binder for now.  PAD/ hx L BKA   Rob Stori Royse  MD [facility] 04/01/2023, 12:30 PM  Recent Labs  Lab 03/30/23 2056 03/31/23 0315 03/31/23 1602 03/31/23 2358 04/01/23 0413  HGB 10.5*  --   --   --  9.2*  ALBUMIN  --  2.9*  --   --  2.6*  CALCIUM  --  9.1  --  8.7* 8.7*  PHOS  --   --  4.1  --  5.2*  CREATININE  --  4.84*  --  5.50* 5.79*  K 3.7 3.7  --  4.5 4.1   Inpatient medications:  acetaminophen  650 mg Oral Q4H   Or   acetaminophen (TYLENOL) oral liquid 160 mg/5 mL  650 mg Per Tube Q4H   Or   acetaminophen  650 mg Rectal Q4H   Chlorhexidine Gluconate Cloth  6 each Topical Daily   docusate  100 mg Per Tube BID   famotidine  20 mg Per Tube BID   feeding supplement (PROSource TF20)  60 mL Per Tube BID   feeding supplement (VITAL 1.5 CAL)  1,000 mL Per Tube Q24H   heparin  5,000 Units Subcutaneous Q8H   multivitamin  1 tablet Per Tube QHS   mouth rinse  15 mL Mouth Rinse Q2H   polyethylene glycol  17 g Per Tube Daily    insulin 16 Units/hr (04/01/23 1100)   levETIRAcetam Stopped (04/01/23 0523)   magnesium sulfate  acetaminophen **OR** acetaminophen (TYLENOL) oral liquid 160 mg/5 mL **OR** acetaminophen, busPIRone **OR** busPIRone, dextrose, hydrALAZINE, labetalol, magnesium sulfate,  ondansetron (ZOFRAN) IV, mouth rinse

## 2023-04-02 ENCOUNTER — Inpatient Hospital Stay (HOSPITAL_COMMUNITY): Payer: Medicaid Other

## 2023-04-02 ENCOUNTER — Other Ambulatory Visit: Payer: Self-pay

## 2023-04-02 DIAGNOSIS — I469 Cardiac arrest, cause unspecified: Secondary | ICD-10-CM | POA: Diagnosis not present

## 2023-04-02 DIAGNOSIS — R569 Unspecified convulsions: Secondary | ICD-10-CM | POA: Diagnosis not present

## 2023-04-02 LAB — COMPREHENSIVE METABOLIC PANEL
ALT: 135 U/L — ABNORMAL HIGH (ref 0–44)
AST: 199 U/L — ABNORMAL HIGH (ref 15–41)
Albumin: 2.4 g/dL — ABNORMAL LOW (ref 3.5–5.0)
Alkaline Phosphatase: 169 U/L — ABNORMAL HIGH (ref 38–126)
Anion gap: 20 — ABNORMAL HIGH (ref 5–15)
BUN: 63 mg/dL — ABNORMAL HIGH (ref 6–20)
CO2: 19 mmol/L — ABNORMAL LOW (ref 22–32)
Calcium: 9.1 mg/dL (ref 8.9–10.3)
Chloride: 101 mmol/L (ref 98–111)
Creatinine, Ser: 4.28 mg/dL — ABNORMAL HIGH (ref 0.44–1.00)
GFR, Estimated: 11 mL/min — ABNORMAL LOW (ref >60)
Glucose, Bld: 170 mg/dL — ABNORMAL HIGH (ref 70–99)
Potassium: 4.4 mmol/L (ref 3.5–5.1)
Sodium: 140 mmol/L (ref 135–145)
Total Bilirubin: 0.8 mg/dL (ref 0.3–1.2)
Total Protein: 6.6 g/dL (ref 6.5–8.1)

## 2023-04-02 LAB — POCT I-STAT 7, (LYTES, BLD GAS, ICA,H+H)
Acid-base deficit: 6 mmol/L — ABNORMAL HIGH (ref 0.0–2.0)
Bicarbonate: 20.5 mmol/L (ref 20.0–28.0)
Calcium, Ion: 1.13 mmol/L — ABNORMAL LOW (ref 1.15–1.40)
HCT: 29 % — ABNORMAL LOW (ref 36.0–46.0)
Hemoglobin: 9.9 g/dL — ABNORMAL LOW (ref 12.0–15.0)
O2 Saturation: 95 %
Patient temperature: 36.8
Potassium: 4.2 mmol/L (ref 3.5–5.1)
Sodium: 137 mmol/L (ref 135–145)
TCO2: 22 mmol/L (ref 22–32)
pCO2 arterial: 44 mm[Hg] (ref 32–48)
pH, Arterial: 7.276 — ABNORMAL LOW (ref 7.35–7.45)
pO2, Arterial: 86 mm[Hg] (ref 83–108)

## 2023-04-02 LAB — PHOSPHORUS: Phosphorus: 5.1 mg/dL — ABNORMAL HIGH (ref 2.5–4.6)

## 2023-04-02 LAB — GLUCOSE, CAPILLARY
Glucose-Capillary: 131 mg/dL — ABNORMAL HIGH (ref 70–99)
Glucose-Capillary: 142 mg/dL — ABNORMAL HIGH (ref 70–99)
Glucose-Capillary: 144 mg/dL — ABNORMAL HIGH (ref 70–99)
Glucose-Capillary: 146 mg/dL — ABNORMAL HIGH (ref 70–99)
Glucose-Capillary: 150 mg/dL — ABNORMAL HIGH (ref 70–99)
Glucose-Capillary: 150 mg/dL — ABNORMAL HIGH (ref 70–99)
Glucose-Capillary: 156 mg/dL — ABNORMAL HIGH (ref 70–99)
Glucose-Capillary: 160 mg/dL — ABNORMAL HIGH (ref 70–99)
Glucose-Capillary: 163 mg/dL — ABNORMAL HIGH (ref 70–99)
Glucose-Capillary: 163 mg/dL — ABNORMAL HIGH (ref 70–99)
Glucose-Capillary: 164 mg/dL — ABNORMAL HIGH (ref 70–99)
Glucose-Capillary: 169 mg/dL — ABNORMAL HIGH (ref 70–99)
Glucose-Capillary: 183 mg/dL — ABNORMAL HIGH (ref 70–99)
Glucose-Capillary: 186 mg/dL — ABNORMAL HIGH (ref 70–99)
Glucose-Capillary: 191 mg/dL — ABNORMAL HIGH (ref 70–99)
Glucose-Capillary: 210 mg/dL — ABNORMAL HIGH (ref 70–99)

## 2023-04-02 LAB — CBC
HCT: 29.9 % — ABNORMAL LOW (ref 36.0–46.0)
Hemoglobin: 9.7 g/dL — ABNORMAL LOW (ref 12.0–15.0)
MCH: 33.7 pg (ref 26.0–34.0)
MCHC: 32.4 g/dL (ref 30.0–36.0)
MCV: 103.8 fL — ABNORMAL HIGH (ref 80.0–100.0)
Platelets: 300 10*3/uL (ref 150–400)
RBC: 2.88 MIL/uL — ABNORMAL LOW (ref 3.87–5.11)
RDW: 18.4 % — ABNORMAL HIGH (ref 11.5–15.5)
WBC: 13.6 10*3/uL — ABNORMAL HIGH (ref 4.0–10.5)
nRBC: 0.5 % — ABNORMAL HIGH (ref 0.0–0.2)

## 2023-04-02 LAB — CULTURE, BLOOD (ROUTINE X 2): Special Requests: ADEQUATE

## 2023-04-02 LAB — CG4 I-STAT (LACTIC ACID): Lactic Acid, Venous: 7.3 mmol/L (ref 0.5–1.9)

## 2023-04-02 LAB — MAGNESIUM: Magnesium: 2.8 mg/dL — ABNORMAL HIGH (ref 1.7–2.4)

## 2023-04-02 MED ORDER — ALBUMIN HUMAN 25 % IV SOLN
25.0000 g | INTRAVENOUS | Status: DC | PRN
  Filled 2023-04-02: qty 100

## 2023-04-02 MED ORDER — SODIUM CHLORIDE 0.9 % IV SOLN: 250.0000 mL | INTRAVENOUS | Status: DC

## 2023-04-02 MED ORDER — FAMOTIDINE 20 MG PO TABS
20.0000 mg | ORAL_TABLET | Freq: Every day | ORAL | Status: DC
  Administered 2023-04-03 – 2023-04-05 (×3): 20 mg
  Filled 2023-04-02 (×3): qty 1

## 2023-04-02 MED ORDER — ANTICOAGULANT SODIUM CITRATE 4% (200MG/5ML) IV SOLN: 5.0000 mL | Status: DC | PRN

## 2023-04-02 MED ORDER — HEPARIN SODIUM (PORCINE) 1000 UNIT/ML DIALYSIS: 1000.0000 [IU] | INTRAMUSCULAR | Status: DC | PRN

## 2023-04-02 MED ORDER — NOREPINEPHRINE 4 MG/250ML-% IV SOLN
INTRAVENOUS | Status: AC
  Filled 2023-04-02: qty 250

## 2023-04-02 MED ORDER — NOREPINEPHRINE 4 MG/250ML-% IV SOLN: 0.0000 ug/min | INTRAVENOUS | Status: DC

## 2023-04-02 MED ORDER — NOREPINEPHRINE 4 MG/250ML-% IV SOLN
0.0000 ug/min | INTRAVENOUS | Status: DC
  Administered 2023-04-02: 2 ug/min via INTRAVENOUS

## 2023-04-02 MED ORDER — NEPRO/CARBSTEADY PO LIQD: 237.0000 mL | ORAL | Status: DC | PRN

## 2023-04-02 MED ORDER — HEPARIN SODIUM (PORCINE) 1000 UNIT/ML IJ SOLN: 2500.0000 [IU] | Freq: Once | INTRAMUSCULAR | Status: DC

## 2023-04-02 MED ORDER — HEPARIN SODIUM (PORCINE) 1000 UNIT/ML DIALYSIS
2500.0000 [IU] | Freq: Once | INTRAMUSCULAR | Status: AC
  Administered 2023-04-02: 2500 [IU] via INTRAVENOUS_CENTRAL

## 2023-04-02 MED ORDER — ALTEPLASE 2 MG IJ SOLR: 2.0000 mg | Freq: Once | INTRAMUSCULAR | Status: DC | PRN

## 2023-04-02 MED ORDER — PROPRANOLOL HCL 40 MG PO TABS
40.0000 mg | ORAL_TABLET | Freq: Three times a day (TID) | ORAL | Status: DC
  Administered 2023-04-02 – 2023-04-05 (×10): 40 mg
  Filled 2023-04-02 (×12): qty 1

## 2023-04-02 MED ORDER — LIDOCAINE HCL (PF) 1 % IJ SOLN: 5.0000 mL | INTRAMUSCULAR | Status: DC | PRN

## 2023-04-02 MED ORDER — HEPARIN SODIUM (PORCINE) 1000 UNIT/ML DIALYSIS: 2500.0000 [IU] | INTRAMUSCULAR | Status: DC | PRN

## 2023-04-02 MED ORDER — LIDOCAINE-PRILOCAINE 2.5-2.5 % EX CREA: 1.0000 | TOPICAL_CREAM | CUTANEOUS | Status: DC | PRN

## 2023-04-02 MED ORDER — DARBEPOETIN ALFA 60 MCG/0.3ML IJ SOSY
60.0000 ug | PREFILLED_SYRINGE | INTRAMUSCULAR | Status: DC
  Administered 2023-04-03: 60 ug via SUBCUTANEOUS
  Filled 2023-04-02: qty 0.3

## 2023-04-02 MED ORDER — ATROPINE SULFATE 1 MG/10ML IJ SOSY
PREFILLED_SYRINGE | INTRAMUSCULAR | Status: AC
  Filled 2023-04-02: qty 10

## 2023-04-02 MED ORDER — PENTAFLUOROPROP-TETRAFLUOROETH EX AERO: 1.0000 | INHALATION_SPRAY | CUTANEOUS | Status: DC | PRN

## 2023-04-02 MED ORDER — EPINEPHRINE 1 MG/10ML IJ SOSY
PREFILLED_SYRINGE | INTRAMUSCULAR | Status: AC | PRN
Start: 2023-04-02 — End: 2023-04-02
  Administered 2023-04-02: 1 mg via INTRAVENOUS

## 2023-04-02 MED ORDER — NOREPINEPHRINE BITARTRATE 1 MG/ML IV SOLN
INTRAVENOUS | Status: AC | PRN
Start: 2023-04-02 — End: 2023-04-02
  Administered 2023-04-02: 10 ug/kg/min via INTRAVENOUS

## 2023-04-02 MED ORDER — SODIUM BICARBONATE 8.4 % IV SOLN
INTRAVENOUS | Status: AC | PRN
Start: 2023-04-02 — End: 2023-04-02
  Administered 2023-04-02: 100 meq via INTRAVENOUS

## 2023-04-02 NOTE — Progress Notes (Signed)
Critical care progress.  An hour into hemodialysis the patient became progressively hypotensive prior to having brief cardiac arrest which improved following 1 amp of epinephrine.  EKG shows symmetric T wave inversions in the inferior lateral anterior leads consistent with diffuse injury but similar to prior tracings.  I performed a point-of-care echocardiogram which showed EF of 40 to 45% normal RV function no significant valvular lesions and an IVC of 2.5 cm essentially consistent with her prior echo earlier on in the week.  -Unclear why she arrested during dialysis which she should have tolerated based on her blood pressure and volume status.  It is possible that she has fairly significant autonomic neuropathy from her diabetes and is simply no longer able to tolerate changes in fluid status.  -Given the need to dialyze away any sedatives, nephrology plans to dialyze her without ultrafiltration this evening.  -Spoke to cousin again who understands that this event further worsens her prognosis.  She will inform the family.  CRITICAL CARE Performed by: Lynnell Catalan   Total critical care time: 40 minutes  Critical care time was exclusive of separately billable procedures and treating other patients.  Critical care was necessary to treat or prevent imminent or life-threatening deterioration.  Critical care was time spent personally by me on the following activities: development of treatment plan with patient and/or surrogate as well as nursing, discussions with consultants, evaluation of patient's response to treatment, examination of patient, obtaining history from patient or surrogate, ordering and performing treatments and interventions, ordering and review of laboratory studies, ordering and review of radiographic studies, pulse oximetry, re-evaluation of patient's condition and participation in multidisciplinary rounds.  Lynnell Catalan, MD [facility] ICU Physician [facility] [place] Critical  Care  Pager: [phone] Mobile: [phone] After hours: [phone].

## 2023-04-02 NOTE — Progress Notes (Signed)
EEG maint complete.  ?

## 2023-04-02 NOTE — Plan of Care (Signed)
Problem: Education: Goal: Ability to manage disease process will improve Outcome: Progressing

## 2023-04-02 NOTE — Progress Notes (Signed)
LTM EEG disconnected - no skin breakdown at unhook.  

## 2023-04-02 NOTE — H&P (Signed)
NAME:  Angela Merritt, MRN:  [id number], DOB:  [birth year], LOS: 3 ADMISSION DATE:  03/30/2023, CONSULTATION DATE:  04/02/2023  REFERRING MD:  Jearld Fenton, EDP , CHIEF COMPLAINT: Cardiac arrest  History of Present Illness:  57 year old dialysis patient who presents postcardiac arrest.  History obtained after speaking to EDP and reviewing medical record. She completed her session of dialysis on Wednesday.  She collapsed collapsed on Thursday at home, but did not immediately receive bystander CPR.  Shock administer by GFD, ROSC obtained after 8 minutes of CPR, then lost pulses again with additional 15 minutes of CPR, shocked x 3, 4 doses of epi.  Had spontaneous breathing, required family grams of Versed and 100 mcg of IV fentanyl and route due to gagging, required transcutaneous pacing for heart rate in the 40s.  Code STEMI called to the EKG showing elevations in inferior leads and and diffuse depressions.  Intubated after arrival to ED, placed on propofol and fentanyl drips.  Code STEMI called off EKG shows incomplete RBBB pattern with atrial fibrillation  Per aunt, her date of birth is [birth year]  Pertinent  Medical History  ESRD on HD M/W/F-left arm AV fistula HTN DM-2 Wheelchair  Significant Hospital Events: Including procedures, antibiotic start and stop dates in addition to other pertinent events   10/10 admitted following cardiac arrest. 10/12 remains unresponsive off sedation.  Interim History / Subjective:  On normothermic targeted temperature management.  No response to painful stimulation.  Objective   Blood pressure (!) 122/48, pulse 91, temperature 98.2 F (36.8 C), temperature source Bladder, resp. rate 18, height 5\' 7"  (1.702 m), weight 75.7 kg, SpO2 95%.    Vent Mode: PRVC FiO2 (%):  [40 %] 40 % Set Rate:  [20 bmp] 20 bmp Vt Set:  [490 mL] 490 mL PEEP:  [5 cmH20] 5 cmH20 Plateau Pressure:  [21 cmH20] 21 cmH20   Intake/Output Summary (Last 24 hours) at 04/02/2023  1522 Last data filed at 04/02/2023 1500 Gross per 24 hour  Intake 1602.96 ml  Output 745 ml  Net 857.96 ml   Filed Weights   04/01/23 0451 04/02/23 0500 04/02/23 1247  Weight: 66.7 kg 68.2 kg 75.7 kg    Examination: General: Obese woman orally intubated. HENT: ET tube OG tube in place Lungs: Clear to auscultation bilaterally.  Tachypneic. Cardiovascular: S1-S2 irregular, distant extremities warm.  Patient hypertensive. Abdomen: Soft, nontender Extremities: LLE amputee, left arm AV fistula thrill.  TTM pads in place.  Patient actively being cooled. Neuro: No response to painful stimulation.  Pupils equal and reactive sluggishly.  Ancillary tests personally reviewed  Creatinine of 4.28 Hyperglycemic with blood sugars above 200, HbA1c 8.1 Mild transaminitis stable Troponin 2771, BNP 271 Lactic acid clear to 1.6 WBC 13.6 Hemoglobin 9.2 Negative initial head CT. Assessment & Plan:   Witnessed VF cardiac arrest with prolonged CPR. 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 History of hypertension History of left lower extremity amputation  Plan:  -Resumed active cooling due to fevers.  Keep off sedation  Follow serial neurological examination.  Tomorrow 10/14 will be 4 days postarrest.  Will obtain noncontrasted MRI tomorrow.  Prognosis guarded given prolonged arrest time and comorbidities. -Spoke to cousin who is her Education officer, environmental.  I explained the process of neuro prognostication following cardiac arrest and have indicated to her that the lack of neurological improvement at nearly 96 hours postarrest is a poor prognostic sign but that we would need  to have her dialyzed to ensure that any residual sedating medication has been eliminated.  Persistently poor examination coupled with an MRI consistent with hypoxic ischemic injury would indicate a poor neurological  prognosis.  If however imaging remains favorable, then she may need further dialysis prior to neuro prognostication. -Optimize glycemic control using IV insulin with Endo tool.  Improved glycemia may also help brain recovery.  Still on high infusion rate.  Not ready to transition. -Continue full ventilator support. -On hydralazine and amlodipine for blood pressure control.  Given tachypnea, hypertension and fever have started propranolol as may be having some sympathetic storming. -For IHD today.   Best Practice (right click and "Reselect all SmartList Selections" daily)   Diet/type: NPO w/ meds via tube on tube feeds DVT prophylaxis: prophylactic heparin  GI prophylaxis: H2B Lines: N/A Foley:  N/A Code Status:  full code Last date of multidisciplinary goals of care discussion [NA] updated aunt [name]  CRITICAL CARE Performed by: Lynnell Catalan   Total critical care time: 35 minutes  Critical care time was exclusive of separately billable procedures and treating other patients.  Critical care was necessary to treat or prevent imminent or life-threatening deterioration.  Critical care was time spent personally by me on the following activities: development of treatment plan with patient and/or surrogate as well as nursing, discussions with consultants, evaluation of patient's response to treatment, examination of patient, obtaining history from patient or surrogate, ordering and performing treatments and interventions, ordering and review of laboratory studies, ordering and review of radiographic studies, pulse oximetry, re-evaluation of patient's condition and participation in multidisciplinary rounds.  Lynnell Catalan, MD [facility] ICU Physician [facility] [place] Critical Care  Pager: [phone] Mobile: [phone] After hours: [phone].

## 2023-04-02 NOTE — Progress Notes (Signed)
  Reviewed Nephrology notes.  Patient received EPO 8400 units with each HD session per outpatient records.  This is equivalent to Aranesp per our P&T protocol substitution.  Entered order for Aranesp dose due 10/14.  [employer], Pharm.D., BCPS Clinical Pharmacist  **Pharmacist phone directory can be found on [email or web address] listed under [facility].  04/02/2023 8:58 PM

## 2023-04-02 NOTE — Progress Notes (Signed)
[facility] Progress Note  Subjective: pt seen in ICU.  Did not get HD overnight due to high census issues.   Vitals:   04/02/23 0657 04/02/23 0700 04/02/23 0800 04/02/23 0811  BP: (!) 177/52   (!) 174/52  Pulse:  (!) 103 (!) 107 (!) 111  Resp:  (!) 27 (!) 26 (!) 41  Temp:    100.2 F (37.9 C)  TempSrc:    Bladder  SpO2:  99% 99%   Weight:      Height:        Exam: Gen on vent, sedated Sclera anicteric, throat w/ ETT No jvd or bruits Chest clear anterior/ lateral RRR no MRG Abd soft ntnd no mass or ascites +bs MS L BKA Ext  no UE or LE edema Neuro is on vent, sedated   L AVF +bruit       Renal-related home meds: - norvasc 10 - eliquis 50 bid - lasix 20 every day - gabapentin 100 tid - renvela ? Dose - others: oxyIR, tramadol, crestor, ozempic, insulin, asa      OP HD: [facility] [address]  MWF  3.5h    53kg   300/500   LUA AVF   Heparin 2600 + 500u/hr - EPO 8400 q hd  - last HD 10/09     CXR 10/10 - no edema no congestion       Assessment/ Plan: VF cardiac arrest - out of hospital w/ prolonged CPR. On cooling protocol and IV keppra for myoclonic seizures.  Acute hypoxic resp failure - on vent support, CXR clear DM2 w/ uncontrolled hyperglycemia  ESRD - on HD MWF. Last HD 10/09. Did not get HD overnight due to high census. Plan HD today or tonight.  HTN/ BP - BP's high. Getting IV BP lowering medications.  Volume - up by wts, not sure accurate. Exam w/o edema and CXR clear. UF 3 L as tolerated w/ HD.  Anemia esrd - Hb 10- 11 here. Get's EPO at OP unit . Will ask pharm for assist w/ conversion.  MBD ckd - CCa and phos are in range. No vdra or binder for now.  PAD/ hx L BKA    Vinson Moselle MD  [facility] 04/02/2023, 9:03 AM  Recent Labs  Lab 03/31/23 0315 03/31/23 1602 03/31/23 2358 04/01/23 0413 04/01/23 1611 04/01/23 1726 04/02/23 0406  HGB  --   --   --  9.2* 9.2*  --   --   ALBUMIN 2.9*  --   --  2.6*  --   --   --    CALCIUM 9.1  --  8.7* 8.7*  --   --   --   PHOS  --    < >  --  5.2*  --  4.6 5.1*  CREATININE 4.84*  --  5.50* 5.79*  --   --   --   K 3.7  --  4.5 4.1 3.9  --   --    < > = values in this interval not displayed.   No results for input(s): "IRON", "TIBC", "FERRITIN" in the last 168 hours. Inpatient medications:  amLODipine  10 mg Per Tube Daily   Chlorhexidine Gluconate Cloth  6 each Topical Daily   Chlorhexidine Gluconate Cloth  6 each Topical Q0600   docusate  100 mg Per Tube BID   famotidine  20 mg Per Tube BID   feeding supplement (PROSource TF20)  60 mL Per Tube BID  feeding supplement (VITAL 1.5 CAL)  1,000 mL Per Tube Q24H   heparin  5,000 Units Subcutaneous Q8H   hydrALAZINE  10 mg Per Tube Q8H   multivitamin  1 tablet Per Tube QHS   mouth rinse  15 mL Mouth Rinse Q2H   polyethylene glycol  17 g Per Tube Daily    insulin 13 Units/hr (04/02/23 0800)   levETIRAcetam Stopped (04/02/23 0521)   acetaminophen **OR** acetaminophen (TYLENOL) oral liquid 160 mg/5 mL **OR** acetaminophen, dextrose, hydrALAZINE, labetalol, ondansetron (ZOFRAN) IV, mouth rinse

## 2023-04-02 NOTE — Progress Notes (Signed)
Received IV consult for ultrasound guided PIV for vasopressor. Patient has 2 working PIVs. Patient not on Vasopressors at this time. Notified primary RN Liborio Nixon that practice for peripheral IV for Vasopressors changed but to call IV team if any further assistance needed.

## 2023-04-02 NOTE — Procedures (Signed)
Patient Name: Angela Merritt  MRN: [id number]  Epilepsy Attending: Charlsie Quest  Referring Physician/Provider: Norton Blizzard, NP  Duration: 04/01/2023 1609 to 04/02/2023 1003   Patient history:  57yo M s/p cardiac arrest getting eeg to evaluate for seizure    Level of alertness: comatose   AEDs during EEG study: LEV   Technical aspects: This EEG study was done with scalp electrodes positioned according to the 10-20 International system of electrode placement. Electrical activity was reviewed with band pass filter of 1-70Hz , sensitivity of 7 uV/mm, display speed of 65mm/sec with a 60Hz  notched filter applied as appropriate. EEG data were recorded continuously and digitally stored.  Video monitoring was available and reviewed as appropriate.   Description:  EEG showed continuous generalized background suppression, not reactive to stimulation. Hyperventilation and photic stimulation were not performed.      ABNORMALITY - Background suppression, generalized   IMPRESSION: This study is suggestive of profound diffuse encephalopathy. No seizures or epileptiform discharges were seen throughout the recording.   Angela Merritt [name]

## 2023-04-02 NOTE — Progress Notes (Signed)
Pt BP began to decrease 20 minutes within the start of tx. 13:31 Dr. Arlean Hopping was notified for prn blood pressure support. Orders were placed for prn albumin for bp support and uf on for SBP>90. 14:12 uf off bp 86/38. Pharmacy was notified via message 14:12 requesting verification of albumin for SBP in the 80s. 14:14 I called pharmacy to verify albumin, albumin was verified. Stopped a therapist in the hall to find a nurse to pull the albumin. I stopped a nurse walking by she went to grab albumin. In the mean time pt pressure rapidly dropping another RN came in checked pt pulse very faint. The nurse hands me albumin. As I am preparing to administer the albumin, floor nurse initiated CPR 14:23 Dialysis tx was terminated. The attending Dr. Denese Killings and Dr. Marlou Sa more nurses from the floor are at the bedside. I called Dr. Arlean Hopping and Salome Holmes, NP they came to the bedside. Pt ran for 1:10 minutes 1L of fluid was removed. Pt needles was removed and sites held for 5 minutes each. The open albumin was discarded and not administered, due to pt receiving epi and on levophed. Report was given to Liborio Nixon RN who was at the bedside with Dr. Denese Killings. Per Dr. Arlean Hopping dialysis later tonight to clean blood with no fluid removal.

## 2023-04-02 NOTE — Progress Notes (Signed)
Chaplain responded to Code Blue alert. Chaplain found pt's cousin, Revonda Standard, in room while staff was working with pt. Chaplain checked in with Revonda Standard to assess how she was coping with the code event. She was found to be coping with the event well and presented no signs of distress. She is positive and hopeful about pt's outcome.  No further spiritual care necessary at this time. However, Spiritual Care is available if requested.   04/02/23 1454  Spiritual Encounters  Type of Visit Initial  Care provided to: Family  Referral source Code page  Reason for visit Code  OnCall Visit Yes  Spiritual Framework  Presenting Themes Coping tools;Courage hope and growth  Community/Connection Family  Family Stress Factors Not reviewed  Interventions  Spiritual Care Interventions Made Established relationship of care and support;Compassionate presence  Intervention Outcomes  Outcomes Connection to spiritual care

## 2023-04-03 DIAGNOSIS — Z9911 Dependence on respirator [ventilator] status: Secondary | ICD-10-CM

## 2023-04-03 DIAGNOSIS — G931 Anoxic brain damage, not elsewhere classified: Secondary | ICD-10-CM

## 2023-04-03 DIAGNOSIS — I469 Cardiac arrest, cause unspecified: Secondary | ICD-10-CM | POA: Diagnosis not present

## 2023-04-03 LAB — CBC WITH DIFFERENTIAL/PLATELET
Abs Immature Granulocytes: 0.03 10*3/uL (ref 0.00–0.07)
Basophils Absolute: 0 10*3/uL (ref 0.0–0.1)
Basophils Relative: 0 %
Eosinophils Absolute: 0 10*3/uL (ref 0.0–0.5)
Eosinophils Relative: 1 %
HCT: 26 % — ABNORMAL LOW (ref 36.0–46.0)
Hemoglobin: 8.5 g/dL — ABNORMAL LOW (ref 12.0–15.0)
Immature Granulocytes: 0 %
Lymphocytes Relative: 8 %
Lymphs Abs: 0.7 10*3/uL (ref 0.7–4.0)
MCH: 32.6 pg (ref 26.0–34.0)
MCHC: 32.7 g/dL (ref 30.0–36.0)
MCV: 99.6 fL (ref 80.0–100.0)
Monocytes Absolute: 0.5 10*3/uL (ref 0.1–1.0)
Monocytes Relative: 6 %
Neutro Abs: 7.5 10*3/uL (ref 1.7–7.7)
Neutrophils Relative %: 85 %
Platelets: 257 10*3/uL (ref 150–400)
RBC: 2.61 MIL/uL — ABNORMAL LOW (ref 3.87–5.11)
RDW: 17.9 % — ABNORMAL HIGH (ref 11.5–15.5)
WBC: 8.8 10*3/uL (ref 4.0–10.5)
nRBC: 0 % (ref 0.0–0.2)

## 2023-04-03 LAB — GLUCOSE, CAPILLARY
Glucose-Capillary: 103 mg/dL — ABNORMAL HIGH (ref 70–99)
Glucose-Capillary: 104 mg/dL — ABNORMAL HIGH (ref 70–99)
Glucose-Capillary: 115 mg/dL — ABNORMAL HIGH (ref 70–99)
Glucose-Capillary: 122 mg/dL — ABNORMAL HIGH (ref 70–99)
Glucose-Capillary: 122 mg/dL — ABNORMAL HIGH (ref 70–99)
Glucose-Capillary: 125 mg/dL — ABNORMAL HIGH (ref 70–99)
Glucose-Capillary: 125 mg/dL — ABNORMAL HIGH (ref 70–99)
Glucose-Capillary: 131 mg/dL — ABNORMAL HIGH (ref 70–99)
Glucose-Capillary: 131 mg/dL — ABNORMAL HIGH (ref 70–99)
Glucose-Capillary: 137 mg/dL — ABNORMAL HIGH (ref 70–99)
Glucose-Capillary: 139 mg/dL — ABNORMAL HIGH (ref 70–99)
Glucose-Capillary: 142 mg/dL — ABNORMAL HIGH (ref 70–99)
Glucose-Capillary: 160 mg/dL — ABNORMAL HIGH (ref 70–99)
Glucose-Capillary: 166 mg/dL — ABNORMAL HIGH (ref 70–99)
Glucose-Capillary: 173 mg/dL — ABNORMAL HIGH (ref 70–99)
Glucose-Capillary: 194 mg/dL — ABNORMAL HIGH (ref 70–99)

## 2023-04-03 LAB — BASIC METABOLIC PANEL
Anion gap: 15 (ref 5–15)
BUN: 44 mg/dL — ABNORMAL HIGH (ref 6–20)
CO2: 27 mmol/L (ref 22–32)
Calcium: 8.4 mg/dL — ABNORMAL LOW (ref 8.9–10.3)
Chloride: 97 mmol/L — ABNORMAL LOW (ref 98–111)
Creatinine, Ser: 3.05 mg/dL — ABNORMAL HIGH (ref 0.44–1.00)
GFR, Estimated: 17 mL/min — ABNORMAL LOW (ref >60)
Glucose, Bld: 110 mg/dL — ABNORMAL HIGH (ref 70–99)
Potassium: 4 mmol/L (ref 3.5–5.1)
Sodium: 139 mmol/L (ref 135–145)

## 2023-04-03 MED ORDER — HYDRALAZINE HCL 25 MG PO TABS
25.0000 mg | ORAL_TABLET | Freq: Three times a day (TID) | ORAL | Status: DC
  Administered 2023-04-03 – 2023-04-04 (×3): 25 mg
  Filled 2023-04-03 (×3): qty 1

## 2023-04-03 NOTE — Progress Notes (Signed)
Consult complete. Patients primary contact identified. Please reconsult TOC for any additional patient needs.

## 2023-04-03 NOTE — IPAL (Signed)
  Interdisciplinary Goals of Care Family Meeting   Date carried out: 04/03/2023  Location of the meeting: Bedside  Member's involved: Physician, Bedside Registered Nurse, and Family Member or next of kin  Durable Power of Attorney or acting medical decision maker: [name]    Discussion: We discussed goals of care for [name] .  I met with [name] cousin and aunt at bedside. We reviewed her poor neuro exam off sedation 4 days into her course, her concerning MRI, and her overall prognosis. They understand how severe her brain injury is and that she will not be able to wake up after this. We reviewed the option to continue long-term dependence on machines, but since she is on HD she would require out of state placement for chronic vent and dialysis care. They related that she would not want chronic long-term aggressive care with a poor prognosis. They want to discuss with other family members before changing her care, but they are planning on transitioning to comfort-focused care int he coming days. We reviewed code status. At this point I recommend against resuscitation if she suffered another cardiac arrest. Angela Merritt agreed, but wants to discuss with one other family member before changing code status, so we will con't full code for now until they are able to speak.   Code status:   Code Status: Full Code   Disposition: Continue current acute care  Time spent for the meeting: 15 min.    Steffanie Dunn, DO  04/03/2023, 2:20 PM

## 2023-04-03 NOTE — Progress Notes (Signed)
RT transported pt to MRI and back on ventilator with no complications. RN and transport at bedside.

## 2023-04-03 NOTE — TOC Initial Note (Signed)
Transition of Care [facility]) - Initial/Assessment Note    Patient Details  Name: Angela Merritt MRN: [id number] Date of Birth: Nov 10, 1965  [facility]) CM/SW Contact:    Elliot Cousin, RN Phone Number:  [phone] 04/03/2023, 8:19 AM  Clinical Narrative:  Spoke to pt's legal guardian, Revonda Standard at bedside. States pt lives at home with family. She has wheelchair, and RW at home. Her family provides transportation to appts. Will continue to follow for dc needs.                  Expected Discharge Plan: Skilled Nursing Facility Barriers to Discharge: Continued Medical Work up   Patient Goals and CMS Choice            Expected Discharge Plan and Services   Discharge Planning Services: CM Consult   Living arrangements for the past 2 months: Apartment                                      Prior Living Arrangements/Services Living arrangements for the past 2 months: Apartment Lives with:: Self, Adult Children, Minor Children              Current home services: DME (rolling walker, wheelchair)    Activities of Daily Living   ADL Screening (condition at time of admission) Independently performs ADLs?: Yes (appropriate for developmental age) Is the patient deaf or have difficulty hearing?: No Does the patient have difficulty seeing, even when wearing glasses/contacts?: No Does the patient have difficulty concentrating, remembering, or making decisions?: No  Permission Sought/Granted Permission sought to share information with : Case Manager, Family Supports Permission granted to share information with : Yes, Verbal Permission Granted  Share Information with NAME: Daniel Nones     Permission granted to share info w Relationship: legal guardian  Permission granted to share info w Contact Information: [phone]  Emotional Assessment   Attitude/Demeanor/Rapport: Intubated (Following Commands or Not Following Commands)          Admission  diagnosis:  Cardiac arrest [facility]) [I46.9] Patient Active Problem List   Diagnosis Date Noted   Cardiac arrest (HCC) 03/21/2023   PCP:  Oneita Hurt, No Pharmacy:   [facility] [phone] - [place], [place] - [address] [place] [address] Phone: [phone] Fax: [phone]     Social Determinants of Health (SDOH) Social History: SDOH Screenings   Food Insecurity: No Food Insecurity (04/02/2023)  Housing: Patient Declined (04/02/2023)  Transportation Needs: No Transportation Needs (04/02/2023)  Utilities: Not At Risk (04/02/2023)  Tobacco Use: High Risk (04/01/2023)   SDOH Interventions:     Readmission Risk Interventions     No data to display

## 2023-04-03 NOTE — Progress Notes (Signed)
[facility] Progress Note  Subjective: pt seen in ICU.  Briefly arrested during initial HD attempt yesterday (1hr in) - unclear etiology.  Had HD without UF later in the day - tolerated.  MRI with anoxic brain injury.     Vitals:   04/03/23 0700 04/03/23 0800 04/03/23 0801 04/03/23 0900  BP:   (!) 173/55   Pulse: 83 87 88 82  Resp: (!) 31 (!) 28 (!) 31 (!) 28  Temp: 98.8 F (37.1 C) 98.6 F (37 C)  98.6 F (37 C)  TempSrc:  Core    SpO2: 99% 99%  100%  Weight:      Height:        Exam: Gen on vent, sedated Sclera anicteric, throat w/ ETT No jvd or bruits Chest clear anterior/ lateral RRR no MRG Abd soft ntnd no mass or ascites +bs MS L BKA Ext  no UE or LE edema Neuro is on vent, sedated   L AVF +bruit       Renal-related home meds: - norvasc 10 - eliquis 50 bid - lasix 20 every day - gabapentin 100 tid - renvela ? Dose - others: oxyIR, tramadol, crestor, ozempic, insulin, asa      OP HD: [facility] [address]  MWF  3.5h    53kg   300/500   LUA AVF   Heparin 2600 + 500u/hr - EPO 8400 q hd  - last HD 10/09     CXR 10/10 - no edema no congestion       Assessment/ Plan: VF cardiac arrest - out of hospital w/ prolonged CPR s/p cooling protocol.  Another brief arrest 10/13, unclear etiology but was 1hr in to HD.   Acute hypoxic resp failure - on vent support, CXR clear DM2 w/ uncontrolled hyperglycemia  ESRD - on HD MWF. HD 10/9, had early AM 10/14 - off at 8am.  Will follow daily to assess need for HD and GOC given new finding of anoxic brain injury on MRI.  HTN/ BP - BP tolerable - on enteral meds. Volume - up by wts, not sure accurate. Exam w/o edema and CXR clear. UF 0.2L with HD, making some urine.   Anemia esrd - Hb trending down, low 8s. On aranesp weekly here. MBD ckd - CCa and phos are in range. No vdra or binder for now.  PAD/ hx L BKA Anoxic brain injury - noted on MRI - GOC will be addressed by primary.    Estill Bakes  MD [facility] Pager [phone]    Recent Labs  Lab 04/01/23 0413 04/01/23 1611 04/01/23 1726 04/02/23 0406 04/02/23 1430 04/02/23 1433 04/03/23 0425  HGB 9.2*   < >  --   --  9.7* 9.9* 8.5*  ALBUMIN 2.6*  --   --   --  2.4*  --   --   CALCIUM 8.7*  --   --   --  9.1  --  8.4*  PHOS 5.2*  --  4.6 5.1*  --   --   --   CREATININE 5.79*  --   --   --  4.28*  --  3.05*  K 4.1   < >  --   --  4.4 4.2 4.0   < > = values in this interval not displayed.   No results for input(s): "IRON", "TIBC", "FERRITIN" in the last 168 hours. Inpatient medications:  amLODipine  10 mg Per Tube Daily   Chlorhexidine Gluconate  Cloth  6 each Topical Daily   Chlorhexidine Gluconate Cloth  6 each Topical Q0600   Darbepoetin Alfa  60 mcg Subcutaneous Q7 days   docusate  100 mg Per Tube BID   famotidine  20 mg Per Tube Daily   feeding supplement (PROSource TF20)  60 mL Per Tube BID   feeding supplement (VITAL 1.5 CAL)  1,000 mL Per Tube Q24H   heparin  5,000 Units Subcutaneous Q8H   heparin sodium (porcine)  2,500 Units Intravenous Once   heparin sodium (porcine)  2,500 Units Intravenous Once   hydrALAZINE  10 mg Per Tube Q8H   multivitamin  1 tablet Per Tube QHS   mouth rinse  15 mL Mouth Rinse Q2H   polyethylene glycol  17 g Per Tube Daily   propranolol  40 mg Per Tube TID    sodium chloride     albumin human     anticoagulant sodium citrate     insulin 5 Units/hr (04/03/23 0700)   levETIRAcetam Stopped (04/03/23 0438)   norepinephrine (LEVOPHED) Adult infusion     acetaminophen **OR** acetaminophen (TYLENOL) oral liquid 160 mg/5 mL **OR** acetaminophen, albumin human, alteplase, anticoagulant sodium citrate, dextrose, feeding supplement (NEPRO CARB STEADY), heparin, heparin, hydrALAZINE, labetalol, lidocaine (PF), lidocaine-prilocaine, ondansetron (ZOFRAN) IV, mouth rinse, pentafluoroprop-tetrafluoroeth

## 2023-04-03 NOTE — Progress Notes (Signed)
NAME:  Angela Merritt, MRN:  [id number], DOB:  May 25, 1966, LOS: 4 ADMISSION DATE:  April 20, 2023, CONSULTATION DATE:  04/03/2023  REFERRING MD:  Jearld Fenton, EDP , CHIEF COMPLAINT: Cardiac arrest  History of Present Illness:  57 year old dialysis patient who presents postcardiac arrest.  History obtained after speaking to EDP and reviewing medical record. She completed her session of dialysis on Wednesday.  She collapsed collapsed on Thursday at home, but did not immediately receive bystander CPR.  Shock administer by GFD, ROSC obtained after 8 minutes of CPR, then lost pulses again with additional 15 minutes of CPR, shocked x 3, 4 doses of epi.  Had spontaneous breathing, required family grams of Versed and 100 mcg of IV fentanyl and route due to gagging, required transcutaneous pacing for heart rate in the 40s.  Code STEMI called to the EKG showing elevations in inferior leads and and diffuse depressions.  Intubated after arrival to ED, placed on propofol and fentanyl drips.  Code STEMI called off EKG shows incomplete RBBB pattern with atrial fibrillation  Per aunt, her date of birth is [birth year]  Pertinent  Medical History  ESRD on HD M/W/F-left arm AV fistula HTN DM-2 Wheelchair  Significant Hospital Events: Including procedures, antibiotic start and stop dates in addition to other pertinent events   10/10 admitted following cardiac arrest. 10/12 remains unresponsive off sedation. 10/13 MRI brain with anoxic brain injury 10/14 HD, still off sedaiton  Interim History / Subjective:  Remains off sedation  Objective   Blood pressure (!) 155/52, pulse 78, temperature 98.6 F (37 C), temperature source Core, resp. rate (!) 32, height 5\' 7"  (1.702 m), weight 69.8 kg, SpO2 99%.    Vent Mode: PRVC FiO2 (%):  [40 %-60 %] 50 % Set Rate:  [20 bmp] 20 bmp Vt Set:  [490 mL] 490 mL PEEP:  [5 cmH20] 5 cmH20 Plateau Pressure:  [21 cmH20-24 cmH20] 24 cmH20   Intake/Output Summary (Last 24 hours) at  04/03/2023 1347 Last data filed at 04/03/2023 1100 Gross per 24 hour  Intake 1326.41 ml  Output 525 ml  Net 801.41 ml   Filed Weights   04/02/23 0500 04/02/23 1247 04/03/23 0431  Weight: 68.2 kg 75.7 kg 69.8 kg    Examination: General: critically ill appearing woman lying in bed in NAD HENT: [place]/AT, eyes anicteric Lungs: breathing comfortably on MV, no ETT secretions Cardiovascular: S1S2, RRR Abdomen: soft, NT Extremities: L AKA, no edema. LUE fistula with thrill. Neuro: breathing over the vent. Sluggish pupils, no corneals, + dolls eyes. No gag, weak and delayed cough reflex. Not withdrawing from painful stimulation.  BUN 44 Cr 3.05 WBC 8.8 H/H 8.5/26 Platelets 257 Brain MRI: bilateral basal ganglia and cortical diffuse abnormalities c/w anoxic brain injury   Ancillary tests personally reviewed  Creatinine of 4.28 Hyperglycemic with blood sugars above 200, HbA1c 8.1 Mild transaminitis stable Troponin 2771, BNP 271 Lactic acid clear to 1.6 WBC 13.6 Hemoglobin 9.2 Negative initial head CT. Assessment & Plan:   Witnessed VF cardiac arrest with prolonged CPR -supportive care -discussed code status with family--see ipal note. Recommend DNR.  -monitor electrolytes; replete as needed  Severe anoxic brain jury -GOC discussions ongoing; planning for comfort-focused care in the coming days  Acute hypoxic respiratory failure requiring MV post-arrest -LTVV -VAP prevention protocol -PAD protocol for sedation -daily SAT & SBT as appropriate  Type 2 diabetes with uncontrolled hyperglycemia -insulin gtt -goal BG 140-180  ESRD on hemodialysis -appreciate nephro's management; had HD this morning -renally dose meds -  strict I/O  History of hypertension -con't amlodipine and propranolol -increase hydralazine to 25mg  TID  History of left lower extremity amputation for gangrene -supportive care   Clement Sayres who is POA and aunt were updated at bedside. All questions  answered. They will be updating additional family members. They have indicated she would not want lifelong nursing care dependent on machines.   Best Practice (right click and "Reselect all SmartList Selections" daily)   Diet/type: tubefeeds  DVT prophylaxis: prophylactic heparin  GI prophylaxis: H2B Lines: N/A Foley:  N/A Code Status:  full code Last date of multidisciplinary goals of care discussion [ see ipal note 10/14]   This patient is critically ill with multiple organ system failure which requires frequent high complexity decision making, assessment, support, evaluation, and titration of therapies. This was completed through the application of advanced monitoring technologies and extensive interpretation of multiple databases. During this encounter critical care time was devoted to patient care services described in this note for 38 minutes.  Steffanie Dunn, DO 04/03/23 2:35 PM [facility]  For contact information, see Amion. If no response to pager, please call PCCM consult pager. After hours, 7PM- 7AM, please call Elink.

## 2023-04-03 NOTE — Plan of Care (Signed)
Problem: Nutrition: Goal: Adequate nutrition will be maintained Outcome: Progressing

## 2023-04-04 DIAGNOSIS — G931 Anoxic brain damage, not elsewhere classified: Secondary | ICD-10-CM | POA: Diagnosis not present

## 2023-04-04 DIAGNOSIS — R739 Hyperglycemia, unspecified: Secondary | ICD-10-CM

## 2023-04-04 DIAGNOSIS — J9601 Acute respiratory failure with hypoxia: Secondary | ICD-10-CM

## 2023-04-04 DIAGNOSIS — I469 Cardiac arrest, cause unspecified: Secondary | ICD-10-CM | POA: Diagnosis not present

## 2023-04-04 DIAGNOSIS — Z9911 Dependence on respirator [ventilator] status: Secondary | ICD-10-CM | POA: Diagnosis not present

## 2023-04-04 LAB — GLUCOSE, CAPILLARY
Glucose-Capillary: 128 mg/dL — ABNORMAL HIGH (ref 70–99)
Glucose-Capillary: 129 mg/dL — ABNORMAL HIGH (ref 70–99)
Glucose-Capillary: 131 mg/dL — ABNORMAL HIGH (ref 70–99)
Glucose-Capillary: 136 mg/dL — ABNORMAL HIGH (ref 70–99)
Glucose-Capillary: 139 mg/dL — ABNORMAL HIGH (ref 70–99)
Glucose-Capillary: 148 mg/dL — ABNORMAL HIGH (ref 70–99)
Glucose-Capillary: 150 mg/dL — ABNORMAL HIGH (ref 70–99)
Glucose-Capillary: 154 mg/dL — ABNORMAL HIGH (ref 70–99)
Glucose-Capillary: 159 mg/dL — ABNORMAL HIGH (ref 70–99)
Glucose-Capillary: 165 mg/dL — ABNORMAL HIGH (ref 70–99)
Glucose-Capillary: 165 mg/dL — ABNORMAL HIGH (ref 70–99)
Glucose-Capillary: 165 mg/dL — ABNORMAL HIGH (ref 70–99)
Glucose-Capillary: 168 mg/dL — ABNORMAL HIGH (ref 70–99)
Glucose-Capillary: 168 mg/dL — ABNORMAL HIGH (ref 70–99)
Glucose-Capillary: 175 mg/dL — ABNORMAL HIGH (ref 70–99)
Glucose-Capillary: 180 mg/dL — ABNORMAL HIGH (ref 70–99)

## 2023-04-04 LAB — CBC
HCT: 24.5 % — ABNORMAL LOW (ref 36.0–46.0)
Hemoglobin: 7.9 g/dL — ABNORMAL LOW (ref 12.0–15.0)
MCH: 33.2 pg (ref 26.0–34.0)
MCHC: 32.2 g/dL (ref 30.0–36.0)
MCV: 102.9 fL — ABNORMAL HIGH (ref 80.0–100.0)
Platelets: 259 10*3/uL (ref 150–400)
RBC: 2.38 MIL/uL — ABNORMAL LOW (ref 3.87–5.11)
RDW: 17.3 % — ABNORMAL HIGH (ref 11.5–15.5)
WBC: 11.9 10*3/uL — ABNORMAL HIGH (ref 4.0–10.5)
nRBC: 0.3 % — ABNORMAL HIGH (ref 0.0–0.2)

## 2023-04-04 LAB — BASIC METABOLIC PANEL
Anion gap: 17 — ABNORMAL HIGH (ref 5–15)
BUN: 84 mg/dL — ABNORMAL HIGH (ref 6–20)
CO2: 25 mmol/L (ref 22–32)
Calcium: 8.2 mg/dL — ABNORMAL LOW (ref 8.9–10.3)
Chloride: 96 mmol/L — ABNORMAL LOW (ref 98–111)
Creatinine, Ser: 4.49 mg/dL — ABNORMAL HIGH (ref 0.44–1.00)
GFR, Estimated: 11 mL/min — ABNORMAL LOW (ref >60)
Glucose, Bld: 135 mg/dL — ABNORMAL HIGH (ref 70–99)
Potassium: 5.2 mmol/L — ABNORMAL HIGH (ref 3.5–5.1)
Sodium: 138 mmol/L (ref 135–145)

## 2023-04-04 LAB — CULTURE, BLOOD (ROUTINE X 2): Culture: NO GROWTH

## 2023-04-04 LAB — APTT: aPTT: 99 s — ABNORMAL HIGH (ref 24–36)

## 2023-04-04 LAB — HEPARIN LEVEL (UNFRACTIONATED)
Heparin Unfractionated: 0.26 [IU]/mL — ABNORMAL LOW (ref 0.30–0.70)
Heparin Unfractionated: 0.59 [IU]/mL (ref 0.30–0.70)

## 2023-04-04 LAB — HEPATITIS B SURFACE ANTIBODY, QUANTITATIVE: Hep B S AB Quant (Post): 120 m[IU]/mL

## 2023-04-04 MED ORDER — GLYCOPYRROLATE 0.2 MG/ML IJ SOLN
0.3000 mg | Freq: Three times a day (TID) | INTRAMUSCULAR | Status: DC
  Administered 2023-04-04 – 2023-04-05 (×4): 0.3 mg via INTRAVENOUS
  Filled 2023-04-04 (×4): qty 2

## 2023-04-04 MED ORDER — INSULIN ASPART 100 UNIT/ML IJ SOLN
6.0000 [IU] | INTRAMUSCULAR | Status: DC
  Administered 2023-04-05 (×2): 6 [IU] via SUBCUTANEOUS

## 2023-04-04 MED ORDER — HYDRALAZINE HCL 50 MG PO TABS
50.0000 mg | ORAL_TABLET | Freq: Three times a day (TID) | ORAL | Status: DC
  Administered 2023-04-04 – 2023-04-05 (×3): 50 mg
  Filled 2023-04-04 (×3): qty 1

## 2023-04-04 MED ORDER — INSULIN ASPART 100 UNIT/ML IJ SOLN
1.0000 [IU] | INTRAMUSCULAR | Status: DC
  Administered 2023-04-05: 1 [IU] via SUBCUTANEOUS

## 2023-04-04 MED ORDER — SODIUM ZIRCONIUM CYCLOSILICATE 10 G PO PACK
10.0000 g | PACK | Freq: Once | ORAL | Status: AC
  Administered 2023-04-04: 10 g via ORAL
  Filled 2023-04-04: qty 1

## 2023-04-04 MED ORDER — ROSUVASTATIN CALCIUM 5 MG PO TABS
10.0000 mg | ORAL_TABLET | Freq: Every day | ORAL | Status: DC
  Administered 2023-04-04 – 2023-04-05 (×2): 10 mg via ORAL
  Filled 2023-04-04 (×2): qty 2

## 2023-04-04 MED ORDER — HEPARIN (PORCINE) 25000 UT/250ML-% IV SOLN
1000.0000 [IU]/h | INTRAVENOUS | Status: DC
  Administered 2023-04-04 – 2023-04-05 (×2): 1000 [IU]/h via INTRAVENOUS
  Filled 2023-04-04 (×2): qty 250

## 2023-04-04 MED ORDER — DEXTROSE 10 % IV SOLN: INTRAVENOUS | Status: DC | PRN

## 2023-04-04 MED ORDER — INSULIN DETEMIR 100 UNIT/ML ~~LOC~~ SOLN
18.0000 [IU] | Freq: Two times a day (BID) | SUBCUTANEOUS | Status: DC
  Administered 2023-04-04: 18 [IU] via SUBCUTANEOUS
  Filled 2023-04-04 (×3): qty 0.18

## 2023-04-04 NOTE — Progress Notes (Signed)
PHARMACY - ANTICOAGULATION CONSULT NOTE  Pharmacy Consult for IV heparin Indication: hx PE, DVT  No Known Allergies  Patient Measurements: Height: 5\' 7"  (170.2 cm) Weight: 72.1 kg (158 lb 15.2 oz) IBW/kg (Calculated) : 61.6 Heparin Dosing Weight: 72 kg  Vital Signs: Temp: 99.3 F (37.4 C) (10/15 1100) Temp Source: Esophageal (10/15 0800) BP: 139/50 (10/15 1117) Pulse Rate: 76 (10/15 1117)  Labs: Recent Labs    04/02/23 1430 04/02/23 1433 04/03/23 0425 04/04/23 0455  HGB 9.7* 9.9* 8.5* 7.9*  HCT 29.9* 29.0* 26.0* 24.5*  PLT 300  --  257 259  CREATININE 4.28*  --  3.05* 4.49*    Estimated Creatinine Clearance: 13.1 mL/min (A) (by C-G formula based on SCr of 4.49 mg/dL (H)).   Medical History: Past Medical History:  Diagnosis Date   Acute respiratory failure due to COVID-19 [facility]) 11/2022   Anemia    Asthma    Chronic kidney disease    Current every day smoker    Diabetic retinopathy (HCC)    DVT (deep venous thrombosis) (HCC)    ESRD (end stage renal disease) on dialysis (HCC)    Hyperlipidemia    Hypertension    Neuropathy    Peripheral vascular disease (HCC)    Pulmonary edema    Pulmonary embolism (HCC)    Type 2 diabetes mellitus (HCC)    Wheelchair dependence     Medications:  Infusions:   albumin human     anticoagulant sodium citrate     heparin     insulin 5.5 Units/hr (04/04/23 1100)   levETIRAcetam Stopped (04/04/23 0515)    Assessment: 57 yo female on chronic Eliquis for hx PE/DVT now s/p arrest and pharmacy asked to switch anticoagulation to IV heparin for now.  Unknown timing of last Eliquis dose.  Goal of Therapy:  Heparin level 0.3-0.7 units/ml Monitor platelets by anticoagulation protocol: Yes   Plan:  Start IV heparin at 1000 units/hr with no bolus Check aPTT and heparin level in 8 hrs Daily heparin level and aPTT, and CBC.  Reece Leader, Colon Flattery, [facility] Clinical Pharmacist  04/04/2023 11:26 AM   [facility] pharmacy phone  numbers are listed on [email or web address]

## 2023-04-04 NOTE — Progress Notes (Signed)
   04/04/23 1156  Spiritual Encounters  Type of Visit Follow up  Care provided to: Pt and family  Conversation partners present during encounter Nurse  Referral source Family  Reason for visit End-of-life  OnCall Visit No   Chaplain received secure message from RN that Pt aunt Merdis Delay) and Pt boyfriend Aneta Mins) had arrived to visit Pt.  Upon arrival chaplain established relationship of compassion, asking opened ended questions to allow family to share their emotional journey as well as their memories of the Pt's life. Pt's aunt did mention to the chaplain that the family would like chaplain presence at Goals of Care meeting tomorrow at 5:30.  This chaplain will arrange for a chaplain presence to be there as it is after hours. Pt's family expressed that both they and the Pt have a deep Christian faith, and their faith is what they are leaning upon in this difficult time.  Chaplain offered prayer and then excused himself to answer a page.  Chaplain services remain available by Spiritual Consult or for emergent cases, paging [phone]  Chaplain Raelene Bott, MDiv [email or web address] [phone]

## 2023-04-04 NOTE — Progress Notes (Signed)
Attempted to reach clinic staff at [facility] to confirm pt is a pt at clinic but was unable to reach staff. Will assist as needed.   Olivia Canter Renal Navigator [phone]

## 2023-04-04 NOTE — Progress Notes (Addendum)
NAME:  Angela Merritt, MRN:  [id number], DOB:  [birth year], LOS: 5 ADMISSION DATE:  04/16/23, CONSULTATION DATE:  04/04/2023  REFERRING MD:  Jearld Fenton, EDP , CHIEF COMPLAINT: Cardiac arrest  History of Present Illness:  57 year old dialysis patient who presents postcardiac arrest.  History obtained after speaking to EDP and reviewing medical record. She completed her session of dialysis on Wednesday.  She collapsed collapsed on Thursday at home, but did not immediately receive bystander CPR.  Shock administer by GFD, ROSC obtained after 8 minutes of CPR, then lost pulses again with additional 15 minutes of CPR, shocked x 3, 4 doses of epi.  Had spontaneous breathing, required family grams of Versed and 100 mcg of IV fentanyl and route due to gagging, required transcutaneous pacing for heart rate in the 40s.  Code STEMI called to the EKG showing elevations in inferior leads and and diffuse depressions.  Intubated after arrival to ED, placed on propofol and fentanyl drips.  Code STEMI called off EKG shows incomplete RBBB pattern with atrial fibrillation  Per aunt, her date of birth is [birth year]  Pertinent  Medical History  ESRD on HD M/W/F-left arm AV fistula HTN DM-2 Wheelchair  Significant Hospital Events: Including procedures, antibiotic start and stop dates in addition to other pertinent events   10/10 admitted following cardiac arrest. 10/12 remains unresponsive off sedation. 10/13 MRI brain with anoxic brain injury 10/14 HD, still off sedaiton; see ipal note  Interim History / Subjective:  Remains off sedation no neurologic improvement  Objective   Blood pressure (!) 155/54, pulse 80, temperature 98.8 F (37.1 C), resp. rate (!) 27, height 5\' 7"  (1.702 m), weight 72.1 kg, SpO2 93%.    Vent Mode: PRVC FiO2 (%):  [40 %-50 %] 50 % Set Rate:  [20 bmp] 20 bmp Vt Set:  [490 mL] 490 mL PEEP:  [5 cmH20] 5 cmH20 Plateau Pressure:  [19 cmH20-24 cmH20] 20 cmH20   Intake/Output Summary  (Last 24 hours) at 04/04/2023 1043 Last data filed at 04/04/2023 1000 Gross per 24 hour  Intake 1419.77 ml  Output 100 ml  Net 1319.77 ml   Filed Weights   04/02/23 1247 04/03/23 0431 04/04/23 0500  Weight: 75.7 kg 69.8 kg 72.1 kg    Examination: General:  critically ill appearing on mech vent HEENT: MM pink/moist; ETT in place Neuro: unresponsive to pain; weak cough/gag present; breathing over vent CV: s1s2, RRR, no m/r/g PULM:  dim clear BS bilaterally; on mech vent PRVC GI: soft, bsx4 active  Extremities: warm/dry, left amputation Skin: no rashes or lesions   Ancillary tests personally reviewed  Creatinine of 4.28 Hyperglycemic with blood sugars above 200, HbA1c 8.1 Mild transaminitis stable Troponin 2771, BNP 271 Lactic acid clear to 1.6 WBC 13.6 Hemoglobin 9.2 Negative initial head CT. Assessment & Plan:   Witnessed VF cardiac arrest with prolonged CPR Plan: -supportive care -trend bmp/mag and replete electrolytes as needed -tele monitoring  Severe anoxic brain jury Plan: -see ipal from 10/14 -plan for comfort care likely tomorrow on 10/16 when family comes to see patient; would like to keep full code for now -limit sedating meds -keppra q12  Acute hypoxic respiratory failure requiring MV post-arrest Plan: -LTVV strategy with tidal volumes of 6-8 cc/kg ideal body weight -Wean PEEP/FiO2 for SpO2 >92% -VAP bundle in place  Type 2 diabetes with uncontrolled hyperglycemia Plan: -insulin gtt -cbg monitoring  ESRD on hemodialysis Plan: -HD per nephro -Trend BMP / urinary output -Replace electrolytes as indicated -Avoid nephrotoxic agents,  ensure adequate renal perfusion;  Hx of PE on eliquis Plan: -heparin gtt  History of hypertension/hld Plan: -con't amlodipine and propranolol -increase hydralazine to 50 q8 -prn hydralazine and labetalol -statin  History of left lower extremity amputation for gangrene -supportive care   Best Practice  (right click and "Reselect all SmartList Selections" daily)   Diet/type: tubefeeds  DVT prophylaxis: systemic heparin GI prophylaxis: H2B Lines: N/A Foley:  N/A Code Status:  full code Last date of multidisciplinary goals of care discussion [ see ipal note 10/14; family updated on 10/15 w/ plan to transition to comfort tomorrow after family visits patient]   Critical care time: 35 minutes  JD Anselm Lis  Pulmonary & Critical Care 04/04/2023, 11:01 AM  Please see [email or web address] for pager details.  From 7A-7P if no response, please call [phone]. After hours, please call ELink [phone].

## 2023-04-04 NOTE — Progress Notes (Signed)
   04/04/23 1122  Spiritual Encounters  Type of Visit Initial  Conversation partners present during encounter Nurse  Reason for visit End-of-life   Chaplain responded to Spiritual Consult stating that transition to comfort care is likely coming soon.  Chaplain visited Pt attempting to meet with family and establish relationship of compassion prior to the transitioning, but family was not present. Chaplain spoke with RN who agreed to private message this chaplain when family is present.  Chaplain services remain available by Spiritual Consult or for emergent cases, paging [phone]  Chaplain Raelene Bott, MDiv [email or web address] [phone]

## 2023-04-04 NOTE — Progress Notes (Signed)
PHARMACY - ANTICOAGULATION CONSULT NOTE  Pharmacy Consult for IV heparin Indication: hx PE, DVT  No Known Allergies  Patient Measurements: Height: 5\' 7"  (170.2 cm) Weight: 72.1 kg (158 lb 15.2 oz) IBW/kg (Calculated) : 61.6 Heparin Dosing Weight: 72 kg  Vital Signs: Temp: 98.8 F (37.1 C) (10/15 2000) Temp Source: Esophageal (10/15 2000) BP: 139/50 (10/15 1117) Pulse Rate: 77 (10/15 2002)  Labs: Recent Labs    04/02/23 1430 04/02/23 1433 04/03/23 0425 04/04/23 0455 04/04/23 1200 04/04/23 2000  HGB 9.7* 9.9* 8.5* 7.9*  --   --   HCT 29.9* 29.0* 26.0* 24.5*  --   --   PLT 300  --  257 259  --   --   APTT  --   --   --   --   --  99*  HEPARINUNFRC  --   --   --   --  0.26* 0.59  CREATININE 4.28*  --  3.05* 4.49*  --   --     Estimated Creatinine Clearance: 13.1 mL/min (A) (by C-G formula based on SCr of 4.49 mg/dL (H)).   Medical History: Past Medical History:  Diagnosis Date   Acute respiratory failure due to COVID-19 [facility]) 11/2022   Anemia    Asthma    Chronic kidney disease    Current every day smoker    Diabetic retinopathy (HCC)    DVT (deep venous thrombosis) (HCC)    ESRD (end stage renal disease) on dialysis (HCC)    Hyperlipidemia    Hypertension    Neuropathy    Peripheral vascular disease (HCC)    Pulmonary edema    Pulmonary embolism (HCC)    Type 2 diabetes mellitus (HCC)    Wheelchair dependence     Medications:  Infusions:   albumin human     anticoagulant sodium citrate     heparin 1,000 Units/hr (04/04/23 1900)   insulin 3.8 Units/hr (04/04/23 1900)   levETIRAcetam Stopped (04/04/23 1630)    Assessment: 57 yo female on chronic Eliquis for hx PE/DVT now s/p arrest and pharmacy asked to switch anticoagulation to IV heparin for now.  Unknown timing of last Eliquis dose.  PM: aPTT 99 and HL 0.59 are both therapeutic on heparin 1000 units/hr. No bleeding or issues with the infusion reported per RN.  Goal of Therapy:  Heparin level  0.3-0.7 units/ml aPTT 66-102 sec Monitor platelets by anticoagulation protocol: Yes   Plan:  Continue IV heparin at 1000 units/hr  Check aPTT and heparin level in 8 hrs to confirm therapeutic Daily heparin level and aPTT, and CBC.  Loralee Pacas, PharmD, BCPS Clinical Pharmacist  04/04/2023 8:46 PM   [facility] pharmacy phone numbers are listed on [email or web address]

## 2023-04-04 NOTE — Progress Notes (Signed)
eLink Physician-Brief Progress Note Patient Name: Angela Merritt DOB: Sep 20, 1965 MRN: [id number]   Date of Service  04/04/2023  HPI/Events of Note  Received request transition off insulin gtt. CBGs 168, 148, 139, 165, 159  Currently on 3.8 units/hr and tube feed. tentative plans for terminal extubation tomorrow.   Can they remove the temperture managemetn pads/order set?  eICU Interventions  Transitioned insulin. Will receive 18 units Levemir tonight. Will need to reassess if morning dose need to be adjusted Will keep pads on and defer to ordering team. Discussed with bedside RN     Intervention Category Intermediate Interventions: Other:  Darl Pikes 04/04/2023, 9:12 PM

## 2023-04-04 NOTE — Progress Notes (Signed)
[facility] Progress Note  Subjective: pt seen in ICU.  MRI showed anoxic brain injury - discussion with family re: GOC underway.  Vitals:   04/04/23 0700 04/04/23 0800 04/04/23 0825 04/04/23 0900  BP:   (!) 155/54   Pulse: 79 80 81 81  Resp: (!) 24 (!) 21 (!) 30 (!) 28  Temp: 97.7 F (36.5 C) 97.9 F (36.6 C)  98.8 F (37.1 C)  TempSrc: Esophageal Esophageal    SpO2: 97% 95%  91%  Weight:      Height:        Exam: Gen on vent, sedated Sclera anicteric, throat w/ ETT No jvd or bruits Chest clear anterior/ lateral RRR no MRG Abd soft ntnd no mass or ascites +bs MS L BKA Ext  no UE or LE edema Neuro is on vent, sedated   L AVF +bruit       Renal-related home meds: - norvasc 10 - eliquis 50 bid - lasix 20 every day - gabapentin 100 tid - renvela ? Dose - others: oxyIR, tramadol, crestor, ozempic, insulin, asa      OP HD: [facility] [address]  MWF  3.5h    53kg   300/500   LUA AVF   Heparin 2600 + 500u/hr - EPO 8400 q hd  - last HD 10/09     CXR 10/10 - no edema no congestion       Assessment/ Plan: VF cardiac arrest - out of hospital w/ prolonged CPR s/p cooling protocol.  Another brief arrest 10/13, unclear etiology but was 1hr in to HD.   Acute hypoxic resp failure - on vent support, CXR clear DM2 w/ uncontrolled hyperglycemia  ESRD - on HD MWF. HD 10/9, had early AM 10/14 - off at 8am.  Will follow daily to assess need for HD and GOC given new finding of anoxic brain injury on MRI.   HTN/ BP - BP tolerable - on enteral meds. Volume - acceptable currently.  Anemia esrd - Hb trending down, low 8s now high 7s. On aranesp weekly here. Transfusion per primary.  Hyperkalemia - lokelma x 1 dose today MBD ckd - CCa and phos are in range. No vdra or binder for now.  PAD/ hx L BKA Anoxic brain injury - noted on MRI - GOC will be addressed by primary -- likely will transition to comfort care at which time would stop dialysis.   Estill Bakes MD [facility] Pager [phone]    Recent Labs  Lab 04/01/23 0413 04/01/23 1611 04/01/23 1726 04/02/23 0406 04/02/23 1430 04/02/23 1433 04/03/23 0425 04/04/23 0455  HGB 9.2*   < >  --   --  9.7*   < > 8.5* 7.9*  ALBUMIN 2.6*  --   --   --  2.4*  --   --   --   CALCIUM 8.7*  --   --   --  9.1  --  8.4* 8.2*  PHOS 5.2*  --  4.6 5.1*  --   --   --   --   CREATININE 5.79*  --   --   --  4.28*  --  3.05* 4.49*  K 4.1   < >  --   --  4.4   < > 4.0 5.2*   < > = values in this interval not displayed.   No results for input(s): "IRON", "TIBC", "FERRITIN" in the last 168 hours. Inpatient medications:  amLODipine  10 mg  Per Tube Daily   Chlorhexidine Gluconate Cloth  6 each Topical Daily   Chlorhexidine Gluconate Cloth  6 each Topical Q0600   Darbepoetin Alfa  60 mcg Subcutaneous Q7 days   docusate  100 mg Per Tube BID   famotidine  20 mg Per Tube Daily   feeding supplement (PROSource TF20)  60 mL Per Tube BID   feeding supplement (VITAL 1.5 CAL)  1,000 mL Per Tube Q24H   heparin  5,000 Units Subcutaneous Q8H   heparin sodium (porcine)  2,500 Units Intravenous Once   heparin sodium (porcine)  2,500 Units Intravenous Once   hydrALAZINE  25 mg Per Tube Q8H   multivitamin  1 tablet Per Tube QHS   mouth rinse  15 mL Mouth Rinse Q2H   polyethylene glycol  17 g Per Tube Daily   propranolol  40 mg Per Tube TID    albumin human     anticoagulant sodium citrate     insulin 4.8 Units/hr (04/04/23 0900)   levETIRAcetam Stopped (04/04/23 0515)   acetaminophen **OR** acetaminophen (TYLENOL) oral liquid 160 mg/5 mL **OR** acetaminophen, albumin human, alteplase, anticoagulant sodium citrate, dextrose, feeding supplement (NEPRO CARB STEADY), heparin, heparin, hydrALAZINE, labetalol, lidocaine (PF), lidocaine-prilocaine, ondansetron (ZOFRAN) IV, mouth rinse, pentafluoroprop-tetrafluoroeth

## 2023-04-05 DIAGNOSIS — J9601 Acute respiratory failure with hypoxia: Secondary | ICD-10-CM | POA: Diagnosis not present

## 2023-04-05 DIAGNOSIS — I469 Cardiac arrest, cause unspecified: Secondary | ICD-10-CM | POA: Diagnosis not present

## 2023-04-05 DIAGNOSIS — I4891 Unspecified atrial fibrillation: Secondary | ICD-10-CM

## 2023-04-05 DIAGNOSIS — Z9911 Dependence on respirator [ventilator] status: Secondary | ICD-10-CM | POA: Diagnosis not present

## 2023-04-05 LAB — RENAL FUNCTION PANEL
Albumin: 1.7 g/dL — ABNORMAL LOW (ref 3.5–5.0)
Anion gap: 21 — ABNORMAL HIGH (ref 5–15)
BUN: 126 mg/dL — ABNORMAL HIGH (ref 6–20)
CO2: 24 mmol/L (ref 22–32)
Calcium: 8.3 mg/dL — ABNORMAL LOW (ref 8.9–10.3)
Chloride: 98 mmol/L (ref 98–111)
Creatinine, Ser: 6.35 mg/dL — ABNORMAL HIGH (ref 0.44–1.00)
GFR, Estimated: 7 mL/min — ABNORMAL LOW (ref >60)
Glucose, Bld: 98 mg/dL (ref 70–99)
Phosphorus: 8.7 mg/dL — ABNORMAL HIGH (ref 2.5–4.6)
Potassium: 5.4 mmol/L — ABNORMAL HIGH (ref 3.5–5.1)
Sodium: 143 mmol/L (ref 135–145)

## 2023-04-05 LAB — HEPARIN LEVEL (UNFRACTIONATED): Heparin Unfractionated: 0.65 [IU]/mL (ref 0.30–0.70)

## 2023-04-05 LAB — GLUCOSE, CAPILLARY
Glucose-Capillary: 100 mg/dL — ABNORMAL HIGH (ref 70–99)
Glucose-Capillary: 111 mg/dL — ABNORMAL HIGH (ref 70–99)
Glucose-Capillary: 146 mg/dL — ABNORMAL HIGH (ref 70–99)
Glucose-Capillary: 53 mg/dL — ABNORMAL LOW (ref 70–99)
Glucose-Capillary: 74 mg/dL (ref 70–99)

## 2023-04-05 LAB — CBC
HCT: 22.9 % — ABNORMAL LOW (ref 36.0–46.0)
Hemoglobin: 7.1 g/dL — ABNORMAL LOW (ref 12.0–15.0)
MCH: 32.4 pg (ref 26.0–34.0)
MCHC: 31 g/dL (ref 30.0–36.0)
MCV: 104.6 fL — ABNORMAL HIGH (ref 80.0–100.0)
Platelets: 263 10*3/uL (ref 150–400)
RBC: 2.19 MIL/uL — ABNORMAL LOW (ref 3.87–5.11)
RDW: 17.1 % — ABNORMAL HIGH (ref 11.5–15.5)
WBC: 14.3 10*3/uL — ABNORMAL HIGH (ref 4.0–10.5)
nRBC: 0.5 % — ABNORMAL HIGH (ref 0.0–0.2)

## 2023-04-05 LAB — APTT: aPTT: 108 s — ABNORMAL HIGH (ref 24–36)

## 2023-04-05 LAB — TRIGLYCERIDES: Triglycerides: 284 mg/dL — ABNORMAL HIGH (ref <150)

## 2023-04-05 MED ORDER — ACETAMINOPHEN 650 MG RE SUPP: 650.0000 mg | Freq: Four times a day (QID) | RECTAL | Status: DC | PRN

## 2023-04-05 MED ORDER — POLYVINYL ALCOHOL 1.4 % OP SOLN: 1.0000 [drp] | Freq: Four times a day (QID) | OPHTHALMIC | Status: DC | PRN

## 2023-04-05 MED ORDER — HEPARIN SODIUM (PORCINE) 5000 UNIT/ML IJ SOLN: 5000.0000 [IU] | Freq: Three times a day (TID) | INTRAMUSCULAR | Status: DC

## 2023-04-05 MED ORDER — GLYCOPYRROLATE 0.2 MG/ML IJ SOLN: 0.2000 mg | INTRAMUSCULAR | Status: DC | PRN

## 2023-04-05 MED ORDER — ACETAMINOPHEN 325 MG PO TABS: 650.0000 mg | ORAL_TABLET | Freq: Four times a day (QID) | ORAL | Status: DC | PRN

## 2023-04-05 MED ORDER — SODIUM ZIRCONIUM CYCLOSILICATE 10 G PO PACK
10.0000 g | PACK | Freq: Once | ORAL | Status: AC
  Administered 2023-04-05: 10 g via ORAL
  Filled 2023-04-05: qty 1

## 2023-04-05 MED ORDER — HALOPERIDOL LACTATE 5 MG/ML IJ SOLN: 2.5000 mg | INTRAMUSCULAR | Status: DC | PRN

## 2023-04-05 MED ORDER — FENTANYL BOLUS VIA INFUSION: 100.0000 ug | INTRAVENOUS | Status: DC | PRN

## 2023-04-05 MED ORDER — DEXTROSE 50 % IV SOLN
50.0000 mL | Freq: Once | INTRAVENOUS | Status: AC
  Administered 2023-04-05: 50 mL via INTRAVENOUS

## 2023-04-05 MED ORDER — MIDAZOLAM HCL 2 MG/2ML IJ SOLN
2.0000 mg | INTRAMUSCULAR | Status: DC | PRN
  Administered 2023-04-05: 2 mg via INTRAVENOUS
  Filled 2023-04-05: qty 4

## 2023-04-05 MED ORDER — FENTANYL 2500MCG IN NS 250ML (10MCG/ML) PREMIX INFUSION
0.0000 ug/h | INTRAVENOUS | Status: DC
  Administered 2023-04-05: 25 ug/h via INTRAVENOUS
  Filled 2023-04-05: qty 250

## 2023-04-05 MED ORDER — GLYCOPYRROLATE 1 MG PO TABS: 1.0000 mg | ORAL_TABLET | ORAL | Status: DC | PRN

## 2023-04-05 MED ORDER — DIPHENHYDRAMINE HCL 50 MG/ML IJ SOLN: 25.0000 mg | INTRAMUSCULAR | Status: DC | PRN

## 2023-04-21 NOTE — Progress Notes (Signed)
04-23-2023 1947  Spiritual Encounters  Type of Visit Follow up  Care provided to: Family;Pt and family  Conversation partners present during encounter Nurse;Physician  Reason for visit Patient death  OnCall Visit Yes  Spiritual Framework  Family Stress Factors Loss of control;Loss   The family requested a chaplain be present with the family when all life support was removed from the patient and to be with the children especially.    I worked with the patient's POA to prepare the room for children to enter and to prepare the patient to be seen by the children for the last time.  I spent time with the family as the nurses prepared the patient and walked into the room with children, mother, and cousin.    I offered comfort and prayed with the family several times the last 30 minutes of the patient's life.  I spent extensive amount of time with the autistic son helping him understand and process his mother's death.  I also spent time with his 7th grade sister, who was very quiet.   Chaplain Jaquay Posthumus Lile-King

## 2023-04-21 NOTE — Death Summary Note (Signed)
10:17 AM Patient Name: Angela Merritt MRN: [id number] Epilepsy Attending: Charlsie Quest Referring Physician/Provider: Lynnell Catalan, MD Date: 03/31/2023 Duration: 23.45 mins Patient history: 57yo M s/p cardiac arrest getting eeg to evaluate for seizure Level of alertness: comatose AEDs during EEG study: Propofol Technical aspects: This EEG study was done with scalp electrodes positioned according to the 10-20 International system of electrode placement. Electrical activity was reviewed with band pass filter of 1-70Hz , sensitivity of 7 uV/mm, display speed of 26mm/sec with a 60Hz  notched filter applied as appropriate. EEG data were recorded continuously and digitally stored.  Video monitoring was available and reviewed as appropriate. Description: EEG showed continuous generalized background suppression, not reactive to stimulation. Hyperventilation and photic stimulation were not performed.   EKG artifact was seen during the study. ABNORMALITY - Background suppression, generalized IMPRESSION: This study is suggestive of profound diffuse encephalopathy. No seizures or epileptiform discharges were seen throughout the recording. Charlsie Quest   CT CHEST ABDOMEN PELVIS W CONTRAST  Result Date: 03/27/2023 CLINICAL DATA:  Polytrauma, blunt post code EXAM: CT CHEST, ABDOMEN, AND PELVIS WITH CONTRAST TECHNIQUE: Multidetector CT imaging of the chest, abdomen and pelvis was performed following the standard protocol during bolus administration of intravenous contrast. RADIATION DOSE REDUCTION: This exam was performed according to the departmental dose-optimization program which includes automated exposure control, adjustment of the mA and/or kV according to patient size and/or use of  iterative reconstruction technique. CONTRAST:  75mL OMNIPAQUE IOHEXOL 350 MG/ML SOLN COMPARISON:  None Available. FINDINGS: CHEST: Cardiovascular: No aortic injury. The thoracic aorta is normal in caliber. The heart is normal in size. No significant pericardial effusion. Severe atherosclerotic plaque. Mitral annular calcification. Four-vessel coronary calcification. Mediastinum/Nodes: No pneumomediastinum. No mediastinal hematoma. The esophagus is unremarkable. The thyroid is unremarkable. The central airways are patent. No mediastinal, hilar, or axillary lymphadenopathy. Lungs/Pleura: No focal consolidation. No pulmonary nodule. No pulmonary mass. No pulmonary contusion or laceration. No pneumatocele formation. Trace bilateral pleural effusion. No pneumothorax. No hemothorax. Musculoskeletal/Chest wall: No chest wall mass. Acute full shaft width displaced right lateral 3rd rib fracture. Acute nondisplaced right anterolateral 4-6 rib fracture. Please see separately dictated CT thoracolumbar spine. ABDOMEN / PELVIS: Hepatobiliary: Enlarged measuring up to 20 cm. No focal lesion. No laceration or subcapsular hematoma. Contracted gallbladder. The gallbladder is otherwise unremarkable with no radio-opaque gallstones. No biliary ductal dilatation. Pancreas: Normal pancreatic contour. No main pancreatic duct dilatation. Spleen: Not enlarged. No focal lesion. No laceration, subcapsular hematoma, or vascular injury. Adrenals/Urinary Tract: No nodularity bilaterally. Bilateral kidneys enhance symmetrically. No hydronephrosis. No contusion, laceration, or subcapsular hematoma. Subcentimeter hypodensities are too small to characterize. Calcifications are likely vascular. No definite nephrolithiasis. No obstructive nephrolithiasis. No injury to the vascular structures or collecting systems. No hydroureter. The urinary bladder is unremarkable. Stomach/Bowel: No small or large bowel wall thickening or dilatation. The appendix is  unremarkable. Vasculature/Lymphatics: Severe atherosclerotic plaque. No abdominal aorta or iliac aneurysm. No active contrast extravasation or pseudoaneurysm. No abdominal, pelvic, inguinal lymphadenopathy. Reproductive: Normal. Other: No simple free fluid ascites. No pneumoperitoneum. No hemoperitoneum. No mesenteric hematoma identified. No organized fluid collection. Musculoskeletal: No significant soft tissue hematoma. Tiny fat containing left inguinal hernia. No acute pelvic fracture. Please see separately dictated CT thoracolumbar spine. Ports and Devices: Endotracheal tube terminates 1 cm above the carina. Enteric tube courses below the hemidiaphragm with tip and side hole within the gastric lumen. Foley catheter tip and inflated balloon terminate within the urinary bladder. IMPRESSION: 1. Endotracheal tube terminates 1 cm above  the carina. Consider retracting by 1 cm. 2. Acute full shaft width displaced right lateral 3rd rib fracture. Acute nondisplaced right anterolateral 4-6 rib fracture. No associated pneumothorax. 3. No acute intrathoracic, intra-abdominal, intrapelvic traumatic injury. 4. Please see separately dictated CT thoracolumbar spine 03/27/2023. 5. Other imaging findings of potential clinical significance: Trace bilateral pleural effusions. Hepatomegaly. Aortic Atherosclerosis (ICD10-I70.0) including four-vessel coronary artery and mitral annular calcification. Electronically Signed   By: Tish Frederickson M.D.   On: 04/18/2023 22:14   CT Head Wo Contrast  Result Date: 04/17/2023 CLINICAL DATA:  Neck trauma (Age >= 65y); Head trauma, minor (Age >= 65y). Fall floor to wheelchair. EXAM: CT HEAD WITHOUT CONTRAST CT CERVICAL SPINE WITHOUT CONTRAST TECHNIQUE: Multidetector CT imaging of the head and cervical spine was performed following the standard protocol without intravenous contrast. Multiplanar CT image reconstructions of the cervical spine were also generated. RADIATION DOSE REDUCTION: This  exam was performed according to the departmental dose-optimization program which includes automated exposure control, adjustment of the mA and/or kV according to patient size and/or use of iterative reconstruction technique. COMPARISON:  None Available. FINDINGS: CT HEAD FINDINGS Brain: No evidence of large-territorial acute infarction. No parenchymal hemorrhage. No mass lesion. No extra-axial collection. No mass effect or midline shift. No hydrocephalus. Basilar cisterns are patent. Vascular: No hyperdense vessel. Skull: No acute fracture or focal lesion. Sinuses/Orbits: Paranasal sinuses and mastoid air cells are clear. Bilateral lens replacement. The orbits are unremarkable. Other: None. CT CERVICAL SPINE FINDINGS Alignment: Normal. Skull base and vertebrae: C5-C7 mild to moderate osteophyte formation. No acute fracture. No aggressive appearing focal osseous lesion or focal pathologic process. Soft tissues and spinal canal: No prevertebral fluid or swelling. No visible canal hematoma. Upper chest: Unremarkable. Other: Partially visualized endotracheal tube within the trachea. Partially visualized enteric tube within the esophagus. Atherosclerotic plaque of the carotid arteries within the neck. IMPRESSION: 1. No acute intracranial abnormality. 2. No acute displaced fracture or traumatic listhesis of the cervical spine. Electronically Signed   By: Tish Frederickson M.D.   On: 04/03/2023 22:00   CT Cervical Spine Wo Contrast  Result Date: 04/13/2023 CLINICAL DATA:  Neck trauma (Age >= 65y); Head trauma, minor (Age >= 65y). Fall floor to wheelchair. EXAM: CT HEAD WITHOUT CONTRAST CT CERVICAL SPINE WITHOUT CONTRAST TECHNIQUE: Multidetector CT imaging of the head and cervical spine was performed following the standard protocol without intravenous contrast. Multiplanar CT image reconstructions of the cervical spine were also generated. RADIATION DOSE REDUCTION: This exam was performed according to the departmental  dose-optimization program which includes automated exposure control, adjustment of the mA and/or kV according to patient size and/or use of iterative reconstruction technique. COMPARISON:  None Available. FINDINGS: CT HEAD FINDINGS Brain: No evidence of large-territorial acute infarction. No parenchymal hemorrhage. No mass lesion. No extra-axial collection. No mass effect or midline shift. No hydrocephalus. Basilar cisterns are patent. Vascular: No hyperdense vessel. Skull: No acute fracture or focal lesion. Sinuses/Orbits: Paranasal sinuses and mastoid air cells are clear. Bilateral lens replacement. The orbits are unremarkable. Other: None. CT CERVICAL SPINE FINDINGS Alignment: Normal. Skull base and vertebrae: C5-C7 mild to moderate osteophyte formation. No acute fracture. No aggressive appearing focal osseous lesion or focal pathologic process. Soft tissues and spinal canal: No prevertebral fluid or swelling. No visible canal hematoma. Upper chest: Unremarkable. Other: Partially visualized endotracheal tube within the trachea. Partially visualized enteric tube within the esophagus. Atherosclerotic plaque of the carotid arteries within the neck. IMPRESSION: 1. No acute intracranial abnormality. 2. No  the carina. Consider retracting by 1 cm. 2. Acute full shaft width displaced right lateral 3rd rib fracture. Acute nondisplaced right anterolateral 4-6 rib fracture. No associated pneumothorax. 3. No acute intrathoracic, intra-abdominal, intrapelvic traumatic injury. 4. Please see separately dictated CT thoracolumbar spine 03/27/2023. 5. Other imaging findings of potential clinical significance: Trace bilateral pleural effusions. Hepatomegaly. Aortic Atherosclerosis (ICD10-I70.0) including four-vessel coronary artery and mitral annular calcification. Electronically Signed   By: Tish Frederickson M.D.   On: 04/18/2023 22:14   CT Head Wo Contrast  Result Date: 04/17/2023 CLINICAL DATA:  Neck trauma (Age >= 65y); Head trauma, minor (Age >= 65y). Fall floor to wheelchair. EXAM: CT HEAD WITHOUT CONTRAST CT CERVICAL SPINE WITHOUT CONTRAST TECHNIQUE: Multidetector CT imaging of the head and cervical spine was performed following the standard protocol without intravenous contrast. Multiplanar CT image reconstructions of the cervical spine were also generated. RADIATION DOSE REDUCTION: This  exam was performed according to the departmental dose-optimization program which includes automated exposure control, adjustment of the mA and/or kV according to patient size and/or use of iterative reconstruction technique. COMPARISON:  None Available. FINDINGS: CT HEAD FINDINGS Brain: No evidence of large-territorial acute infarction. No parenchymal hemorrhage. No mass lesion. No extra-axial collection. No mass effect or midline shift. No hydrocephalus. Basilar cisterns are patent. Vascular: No hyperdense vessel. Skull: No acute fracture or focal lesion. Sinuses/Orbits: Paranasal sinuses and mastoid air cells are clear. Bilateral lens replacement. The orbits are unremarkable. Other: None. CT CERVICAL SPINE FINDINGS Alignment: Normal. Skull base and vertebrae: C5-C7 mild to moderate osteophyte formation. No acute fracture. No aggressive appearing focal osseous lesion or focal pathologic process. Soft tissues and spinal canal: No prevertebral fluid or swelling. No visible canal hematoma. Upper chest: Unremarkable. Other: Partially visualized endotracheal tube within the trachea. Partially visualized enteric tube within the esophagus. Atherosclerotic plaque of the carotid arteries within the neck. IMPRESSION: 1. No acute intracranial abnormality. 2. No acute displaced fracture or traumatic listhesis of the cervical spine. Electronically Signed   By: Tish Frederickson M.D.   On: 04/03/2023 22:00   CT Cervical Spine Wo Contrast  Result Date: 04/13/2023 CLINICAL DATA:  Neck trauma (Age >= 65y); Head trauma, minor (Age >= 65y). Fall floor to wheelchair. EXAM: CT HEAD WITHOUT CONTRAST CT CERVICAL SPINE WITHOUT CONTRAST TECHNIQUE: Multidetector CT imaging of the head and cervical spine was performed following the standard protocol without intravenous contrast. Multiplanar CT image reconstructions of the cervical spine were also generated. RADIATION DOSE REDUCTION: This exam was performed according to the departmental  dose-optimization program which includes automated exposure control, adjustment of the mA and/or kV according to patient size and/or use of iterative reconstruction technique. COMPARISON:  None Available. FINDINGS: CT HEAD FINDINGS Brain: No evidence of large-territorial acute infarction. No parenchymal hemorrhage. No mass lesion. No extra-axial collection. No mass effect or midline shift. No hydrocephalus. Basilar cisterns are patent. Vascular: No hyperdense vessel. Skull: No acute fracture or focal lesion. Sinuses/Orbits: Paranasal sinuses and mastoid air cells are clear. Bilateral lens replacement. The orbits are unremarkable. Other: None. CT CERVICAL SPINE FINDINGS Alignment: Normal. Skull base and vertebrae: C5-C7 mild to moderate osteophyte formation. No acute fracture. No aggressive appearing focal osseous lesion or focal pathologic process. Soft tissues and spinal canal: No prevertebral fluid or swelling. No visible canal hematoma. Upper chest: Unremarkable. Other: Partially visualized endotracheal tube within the trachea. Partially visualized enteric tube within the esophagus. Atherosclerotic plaque of the carotid arteries within the neck. IMPRESSION: 1. No acute intracranial abnormality. 2. No  10:17 AM Patient Name: Angela Merritt MRN: [id number] Epilepsy Attending: Charlsie Quest Referring Physician/Provider: Lynnell Catalan, MD Date: 03/31/2023 Duration: 23.45 mins Patient history: 57yo M s/p cardiac arrest getting eeg to evaluate for seizure Level of alertness: comatose AEDs during EEG study: Propofol Technical aspects: This EEG study was done with scalp electrodes positioned according to the 10-20 International system of electrode placement. Electrical activity was reviewed with band pass filter of 1-70Hz , sensitivity of 7 uV/mm, display speed of 26mm/sec with a 60Hz  notched filter applied as appropriate. EEG data were recorded continuously and digitally stored.  Video monitoring was available and reviewed as appropriate. Description: EEG showed continuous generalized background suppression, not reactive to stimulation. Hyperventilation and photic stimulation were not performed.   EKG artifact was seen during the study. ABNORMALITY - Background suppression, generalized IMPRESSION: This study is suggestive of profound diffuse encephalopathy. No seizures or epileptiform discharges were seen throughout the recording. Charlsie Quest   CT CHEST ABDOMEN PELVIS W CONTRAST  Result Date: 03/27/2023 CLINICAL DATA:  Polytrauma, blunt post code EXAM: CT CHEST, ABDOMEN, AND PELVIS WITH CONTRAST TECHNIQUE: Multidetector CT imaging of the chest, abdomen and pelvis was performed following the standard protocol during bolus administration of intravenous contrast. RADIATION DOSE REDUCTION: This exam was performed according to the departmental dose-optimization program which includes automated exposure control, adjustment of the mA and/or kV according to patient size and/or use of  iterative reconstruction technique. CONTRAST:  75mL OMNIPAQUE IOHEXOL 350 MG/ML SOLN COMPARISON:  None Available. FINDINGS: CHEST: Cardiovascular: No aortic injury. The thoracic aorta is normal in caliber. The heart is normal in size. No significant pericardial effusion. Severe atherosclerotic plaque. Mitral annular calcification. Four-vessel coronary calcification. Mediastinum/Nodes: No pneumomediastinum. No mediastinal hematoma. The esophagus is unremarkable. The thyroid is unremarkable. The central airways are patent. No mediastinal, hilar, or axillary lymphadenopathy. Lungs/Pleura: No focal consolidation. No pulmonary nodule. No pulmonary mass. No pulmonary contusion or laceration. No pneumatocele formation. Trace bilateral pleural effusion. No pneumothorax. No hemothorax. Musculoskeletal/Chest wall: No chest wall mass. Acute full shaft width displaced right lateral 3rd rib fracture. Acute nondisplaced right anterolateral 4-6 rib fracture. Please see separately dictated CT thoracolumbar spine. ABDOMEN / PELVIS: Hepatobiliary: Enlarged measuring up to 20 cm. No focal lesion. No laceration or subcapsular hematoma. Contracted gallbladder. The gallbladder is otherwise unremarkable with no radio-opaque gallstones. No biliary ductal dilatation. Pancreas: Normal pancreatic contour. No main pancreatic duct dilatation. Spleen: Not enlarged. No focal lesion. No laceration, subcapsular hematoma, or vascular injury. Adrenals/Urinary Tract: No nodularity bilaterally. Bilateral kidneys enhance symmetrically. No hydronephrosis. No contusion, laceration, or subcapsular hematoma. Subcentimeter hypodensities are too small to characterize. Calcifications are likely vascular. No definite nephrolithiasis. No obstructive nephrolithiasis. No injury to the vascular structures or collecting systems. No hydroureter. The urinary bladder is unremarkable. Stomach/Bowel: No small or large bowel wall thickening or dilatation. The appendix is  unremarkable. Vasculature/Lymphatics: Severe atherosclerotic plaque. No abdominal aorta or iliac aneurysm. No active contrast extravasation or pseudoaneurysm. No abdominal, pelvic, inguinal lymphadenopathy. Reproductive: Normal. Other: No simple free fluid ascites. No pneumoperitoneum. No hemoperitoneum. No mesenteric hematoma identified. No organized fluid collection. Musculoskeletal: No significant soft tissue hematoma. Tiny fat containing left inguinal hernia. No acute pelvic fracture. Please see separately dictated CT thoracolumbar spine. Ports and Devices: Endotracheal tube terminates 1 cm above the carina. Enteric tube courses below the hemidiaphragm with tip and side hole within the gastric lumen. Foley catheter tip and inflated balloon terminate within the urinary bladder. IMPRESSION: 1. Endotracheal tube terminates 1 cm above  the carina. Consider retracting by 1 cm. 2. Acute full shaft width displaced right lateral 3rd rib fracture. Acute nondisplaced right anterolateral 4-6 rib fracture. No associated pneumothorax. 3. No acute intrathoracic, intra-abdominal, intrapelvic traumatic injury. 4. Please see separately dictated CT thoracolumbar spine 03/27/2023. 5. Other imaging findings of potential clinical significance: Trace bilateral pleural effusions. Hepatomegaly. Aortic Atherosclerosis (ICD10-I70.0) including four-vessel coronary artery and mitral annular calcification. Electronically Signed   By: Tish Frederickson M.D.   On: 04/18/2023 22:14   CT Head Wo Contrast  Result Date: 04/17/2023 CLINICAL DATA:  Neck trauma (Age >= 65y); Head trauma, minor (Age >= 65y). Fall floor to wheelchair. EXAM: CT HEAD WITHOUT CONTRAST CT CERVICAL SPINE WITHOUT CONTRAST TECHNIQUE: Multidetector CT imaging of the head and cervical spine was performed following the standard protocol without intravenous contrast. Multiplanar CT image reconstructions of the cervical spine were also generated. RADIATION DOSE REDUCTION: This  exam was performed according to the departmental dose-optimization program which includes automated exposure control, adjustment of the mA and/or kV according to patient size and/or use of iterative reconstruction technique. COMPARISON:  None Available. FINDINGS: CT HEAD FINDINGS Brain: No evidence of large-territorial acute infarction. No parenchymal hemorrhage. No mass lesion. No extra-axial collection. No mass effect or midline shift. No hydrocephalus. Basilar cisterns are patent. Vascular: No hyperdense vessel. Skull: No acute fracture or focal lesion. Sinuses/Orbits: Paranasal sinuses and mastoid air cells are clear. Bilateral lens replacement. The orbits are unremarkable. Other: None. CT CERVICAL SPINE FINDINGS Alignment: Normal. Skull base and vertebrae: C5-C7 mild to moderate osteophyte formation. No acute fracture. No aggressive appearing focal osseous lesion or focal pathologic process. Soft tissues and spinal canal: No prevertebral fluid or swelling. No visible canal hematoma. Upper chest: Unremarkable. Other: Partially visualized endotracheal tube within the trachea. Partially visualized enteric tube within the esophagus. Atherosclerotic plaque of the carotid arteries within the neck. IMPRESSION: 1. No acute intracranial abnormality. 2. No acute displaced fracture or traumatic listhesis of the cervical spine. Electronically Signed   By: Tish Frederickson M.D.   On: 04/03/2023 22:00   CT Cervical Spine Wo Contrast  Result Date: 04/13/2023 CLINICAL DATA:  Neck trauma (Age >= 65y); Head trauma, minor (Age >= 65y). Fall floor to wheelchair. EXAM: CT HEAD WITHOUT CONTRAST CT CERVICAL SPINE WITHOUT CONTRAST TECHNIQUE: Multidetector CT imaging of the head and cervical spine was performed following the standard protocol without intravenous contrast. Multiplanar CT image reconstructions of the cervical spine were also generated. RADIATION DOSE REDUCTION: This exam was performed according to the departmental  dose-optimization program which includes automated exposure control, adjustment of the mA and/or kV according to patient size and/or use of iterative reconstruction technique. COMPARISON:  None Available. FINDINGS: CT HEAD FINDINGS Brain: No evidence of large-territorial acute infarction. No parenchymal hemorrhage. No mass lesion. No extra-axial collection. No mass effect or midline shift. No hydrocephalus. Basilar cisterns are patent. Vascular: No hyperdense vessel. Skull: No acute fracture or focal lesion. Sinuses/Orbits: Paranasal sinuses and mastoid air cells are clear. Bilateral lens replacement. The orbits are unremarkable. Other: None. CT CERVICAL SPINE FINDINGS Alignment: Normal. Skull base and vertebrae: C5-C7 mild to moderate osteophyte formation. No acute fracture. No aggressive appearing focal osseous lesion or focal pathologic process. Soft tissues and spinal canal: No prevertebral fluid or swelling. No visible canal hematoma. Upper chest: Unremarkable. Other: Partially visualized endotracheal tube within the trachea. Partially visualized enteric tube within the esophagus. Atherosclerotic plaque of the carotid arteries within the neck. IMPRESSION: 1. No acute intracranial abnormality. 2. No  the carina. Consider retracting by 1 cm. 2. Acute full shaft width displaced right lateral 3rd rib fracture. Acute nondisplaced right anterolateral 4-6 rib fracture. No associated pneumothorax. 3. No acute intrathoracic, intra-abdominal, intrapelvic traumatic injury. 4. Please see separately dictated CT thoracolumbar spine 03/27/2023. 5. Other imaging findings of potential clinical significance: Trace bilateral pleural effusions. Hepatomegaly. Aortic Atherosclerosis (ICD10-I70.0) including four-vessel coronary artery and mitral annular calcification. Electronically Signed   By: Tish Frederickson M.D.   On: 04/18/2023 22:14   CT Head Wo Contrast  Result Date: 04/17/2023 CLINICAL DATA:  Neck trauma (Age >= 65y); Head trauma, minor (Age >= 65y). Fall floor to wheelchair. EXAM: CT HEAD WITHOUT CONTRAST CT CERVICAL SPINE WITHOUT CONTRAST TECHNIQUE: Multidetector CT imaging of the head and cervical spine was performed following the standard protocol without intravenous contrast. Multiplanar CT image reconstructions of the cervical spine were also generated. RADIATION DOSE REDUCTION: This  exam was performed according to the departmental dose-optimization program which includes automated exposure control, adjustment of the mA and/or kV according to patient size and/or use of iterative reconstruction technique. COMPARISON:  None Available. FINDINGS: CT HEAD FINDINGS Brain: No evidence of large-territorial acute infarction. No parenchymal hemorrhage. No mass lesion. No extra-axial collection. No mass effect or midline shift. No hydrocephalus. Basilar cisterns are patent. Vascular: No hyperdense vessel. Skull: No acute fracture or focal lesion. Sinuses/Orbits: Paranasal sinuses and mastoid air cells are clear. Bilateral lens replacement. The orbits are unremarkable. Other: None. CT CERVICAL SPINE FINDINGS Alignment: Normal. Skull base and vertebrae: C5-C7 mild to moderate osteophyte formation. No acute fracture. No aggressive appearing focal osseous lesion or focal pathologic process. Soft tissues and spinal canal: No prevertebral fluid or swelling. No visible canal hematoma. Upper chest: Unremarkable. Other: Partially visualized endotracheal tube within the trachea. Partially visualized enteric tube within the esophagus. Atherosclerotic plaque of the carotid arteries within the neck. IMPRESSION: 1. No acute intracranial abnormality. 2. No acute displaced fracture or traumatic listhesis of the cervical spine. Electronically Signed   By: Tish Frederickson M.D.   On: 04/03/2023 22:00   CT Cervical Spine Wo Contrast  Result Date: 04/13/2023 CLINICAL DATA:  Neck trauma (Age >= 65y); Head trauma, minor (Age >= 65y). Fall floor to wheelchair. EXAM: CT HEAD WITHOUT CONTRAST CT CERVICAL SPINE WITHOUT CONTRAST TECHNIQUE: Multidetector CT imaging of the head and cervical spine was performed following the standard protocol without intravenous contrast. Multiplanar CT image reconstructions of the cervical spine were also generated. RADIATION DOSE REDUCTION: This exam was performed according to the departmental  dose-optimization program which includes automated exposure control, adjustment of the mA and/or kV according to patient size and/or use of iterative reconstruction technique. COMPARISON:  None Available. FINDINGS: CT HEAD FINDINGS Brain: No evidence of large-territorial acute infarction. No parenchymal hemorrhage. No mass lesion. No extra-axial collection. No mass effect or midline shift. No hydrocephalus. Basilar cisterns are patent. Vascular: No hyperdense vessel. Skull: No acute fracture or focal lesion. Sinuses/Orbits: Paranasal sinuses and mastoid air cells are clear. Bilateral lens replacement. The orbits are unremarkable. Other: None. CT CERVICAL SPINE FINDINGS Alignment: Normal. Skull base and vertebrae: C5-C7 mild to moderate osteophyte formation. No acute fracture. No aggressive appearing focal osseous lesion or focal pathologic process. Soft tissues and spinal canal: No prevertebral fluid or swelling. No visible canal hematoma. Upper chest: Unremarkable. Other: Partially visualized endotracheal tube within the trachea. Partially visualized enteric tube within the esophagus. Atherosclerotic plaque of the carotid arteries within the neck. IMPRESSION: 1. No acute intracranial abnormality. 2. No  the carina. Consider retracting by 1 cm. 2. Acute full shaft width displaced right lateral 3rd rib fracture. Acute nondisplaced right anterolateral 4-6 rib fracture. No associated pneumothorax. 3. No acute intrathoracic, intra-abdominal, intrapelvic traumatic injury. 4. Please see separately dictated CT thoracolumbar spine 03/27/2023. 5. Other imaging findings of potential clinical significance: Trace bilateral pleural effusions. Hepatomegaly. Aortic Atherosclerosis (ICD10-I70.0) including four-vessel coronary artery and mitral annular calcification. Electronically Signed   By: Tish Frederickson M.D.   On: 04/18/2023 22:14   CT Head Wo Contrast  Result Date: 04/17/2023 CLINICAL DATA:  Neck trauma (Age >= 65y); Head trauma, minor (Age >= 65y). Fall floor to wheelchair. EXAM: CT HEAD WITHOUT CONTRAST CT CERVICAL SPINE WITHOUT CONTRAST TECHNIQUE: Multidetector CT imaging of the head and cervical spine was performed following the standard protocol without intravenous contrast. Multiplanar CT image reconstructions of the cervical spine were also generated. RADIATION DOSE REDUCTION: This  exam was performed according to the departmental dose-optimization program which includes automated exposure control, adjustment of the mA and/or kV according to patient size and/or use of iterative reconstruction technique. COMPARISON:  None Available. FINDINGS: CT HEAD FINDINGS Brain: No evidence of large-territorial acute infarction. No parenchymal hemorrhage. No mass lesion. No extra-axial collection. No mass effect or midline shift. No hydrocephalus. Basilar cisterns are patent. Vascular: No hyperdense vessel. Skull: No acute fracture or focal lesion. Sinuses/Orbits: Paranasal sinuses and mastoid air cells are clear. Bilateral lens replacement. The orbits are unremarkable. Other: None. CT CERVICAL SPINE FINDINGS Alignment: Normal. Skull base and vertebrae: C5-C7 mild to moderate osteophyte formation. No acute fracture. No aggressive appearing focal osseous lesion or focal pathologic process. Soft tissues and spinal canal: No prevertebral fluid or swelling. No visible canal hematoma. Upper chest: Unremarkable. Other: Partially visualized endotracheal tube within the trachea. Partially visualized enteric tube within the esophagus. Atherosclerotic plaque of the carotid arteries within the neck. IMPRESSION: 1. No acute intracranial abnormality. 2. No acute displaced fracture or traumatic listhesis of the cervical spine. Electronically Signed   By: Tish Frederickson M.D.   On: 04/03/2023 22:00   CT Cervical Spine Wo Contrast  Result Date: 04/13/2023 CLINICAL DATA:  Neck trauma (Age >= 65y); Head trauma, minor (Age >= 65y). Fall floor to wheelchair. EXAM: CT HEAD WITHOUT CONTRAST CT CERVICAL SPINE WITHOUT CONTRAST TECHNIQUE: Multidetector CT imaging of the head and cervical spine was performed following the standard protocol without intravenous contrast. Multiplanar CT image reconstructions of the cervical spine were also generated. RADIATION DOSE REDUCTION: This exam was performed according to the departmental  dose-optimization program which includes automated exposure control, adjustment of the mA and/or kV according to patient size and/or use of iterative reconstruction technique. COMPARISON:  None Available. FINDINGS: CT HEAD FINDINGS Brain: No evidence of large-territorial acute infarction. No parenchymal hemorrhage. No mass lesion. No extra-axial collection. No mass effect or midline shift. No hydrocephalus. Basilar cisterns are patent. Vascular: No hyperdense vessel. Skull: No acute fracture or focal lesion. Sinuses/Orbits: Paranasal sinuses and mastoid air cells are clear. Bilateral lens replacement. The orbits are unremarkable. Other: None. CT CERVICAL SPINE FINDINGS Alignment: Normal. Skull base and vertebrae: C5-C7 mild to moderate osteophyte formation. No acute fracture. No aggressive appearing focal osseous lesion or focal pathologic process. Soft tissues and spinal canal: No prevertebral fluid or swelling. No visible canal hematoma. Upper chest: Unremarkable. Other: Partially visualized endotracheal tube within the trachea. Partially visualized enteric tube within the esophagus. Atherosclerotic plaque of the carotid arteries within the neck. IMPRESSION: 1. No acute intracranial abnormality. 2. No  the carina. Consider retracting by 1 cm. 2. Acute full shaft width displaced right lateral 3rd rib fracture. Acute nondisplaced right anterolateral 4-6 rib fracture. No associated pneumothorax. 3. No acute intrathoracic, intra-abdominal, intrapelvic traumatic injury. 4. Please see separately dictated CT thoracolumbar spine 03/27/2023. 5. Other imaging findings of potential clinical significance: Trace bilateral pleural effusions. Hepatomegaly. Aortic Atherosclerosis (ICD10-I70.0) including four-vessel coronary artery and mitral annular calcification. Electronically Signed   By: Tish Frederickson M.D.   On: 04/18/2023 22:14   CT Head Wo Contrast  Result Date: 04/17/2023 CLINICAL DATA:  Neck trauma (Age >= 65y); Head trauma, minor (Age >= 65y). Fall floor to wheelchair. EXAM: CT HEAD WITHOUT CONTRAST CT CERVICAL SPINE WITHOUT CONTRAST TECHNIQUE: Multidetector CT imaging of the head and cervical spine was performed following the standard protocol without intravenous contrast. Multiplanar CT image reconstructions of the cervical spine were also generated. RADIATION DOSE REDUCTION: This  exam was performed according to the departmental dose-optimization program which includes automated exposure control, adjustment of the mA and/or kV according to patient size and/or use of iterative reconstruction technique. COMPARISON:  None Available. FINDINGS: CT HEAD FINDINGS Brain: No evidence of large-territorial acute infarction. No parenchymal hemorrhage. No mass lesion. No extra-axial collection. No mass effect or midline shift. No hydrocephalus. Basilar cisterns are patent. Vascular: No hyperdense vessel. Skull: No acute fracture or focal lesion. Sinuses/Orbits: Paranasal sinuses and mastoid air cells are clear. Bilateral lens replacement. The orbits are unremarkable. Other: None. CT CERVICAL SPINE FINDINGS Alignment: Normal. Skull base and vertebrae: C5-C7 mild to moderate osteophyte formation. No acute fracture. No aggressive appearing focal osseous lesion or focal pathologic process. Soft tissues and spinal canal: No prevertebral fluid or swelling. No visible canal hematoma. Upper chest: Unremarkable. Other: Partially visualized endotracheal tube within the trachea. Partially visualized enteric tube within the esophagus. Atherosclerotic plaque of the carotid arteries within the neck. IMPRESSION: 1. No acute intracranial abnormality. 2. No acute displaced fracture or traumatic listhesis of the cervical spine. Electronically Signed   By: Tish Frederickson M.D.   On: 04/03/2023 22:00   CT Cervical Spine Wo Contrast  Result Date: 04/13/2023 CLINICAL DATA:  Neck trauma (Age >= 65y); Head trauma, minor (Age >= 65y). Fall floor to wheelchair. EXAM: CT HEAD WITHOUT CONTRAST CT CERVICAL SPINE WITHOUT CONTRAST TECHNIQUE: Multidetector CT imaging of the head and cervical spine was performed following the standard protocol without intravenous contrast. Multiplanar CT image reconstructions of the cervical spine were also generated. RADIATION DOSE REDUCTION: This exam was performed according to the departmental  dose-optimization program which includes automated exposure control, adjustment of the mA and/or kV according to patient size and/or use of iterative reconstruction technique. COMPARISON:  None Available. FINDINGS: CT HEAD FINDINGS Brain: No evidence of large-territorial acute infarction. No parenchymal hemorrhage. No mass lesion. No extra-axial collection. No mass effect or midline shift. No hydrocephalus. Basilar cisterns are patent. Vascular: No hyperdense vessel. Skull: No acute fracture or focal lesion. Sinuses/Orbits: Paranasal sinuses and mastoid air cells are clear. Bilateral lens replacement. The orbits are unremarkable. Other: None. CT CERVICAL SPINE FINDINGS Alignment: Normal. Skull base and vertebrae: C5-C7 mild to moderate osteophyte formation. No acute fracture. No aggressive appearing focal osseous lesion or focal pathologic process. Soft tissues and spinal canal: No prevertebral fluid or swelling. No visible canal hematoma. Upper chest: Unremarkable. Other: Partially visualized endotracheal tube within the trachea. Partially visualized enteric tube within the esophagus. Atherosclerotic plaque of the carotid arteries within the neck. IMPRESSION: 1. No acute intracranial abnormality. 2. No

## 2023-04-21 NOTE — Progress Notes (Signed)
NAME:  Angela Merritt, MRN:  [id number], DOB:  1966-04-22, LOS: 6 ADMISSION DATE:  03/30/2023, CONSULTATION DATE:  04-08-23  REFERRING MD:  Jearld Fenton, EDP , CHIEF COMPLAINT: Cardiac arrest  History of Present Illness:  57 year old dialysis patient who presents postcardiac arrest.  History obtained after speaking to EDP and reviewing medical record. She completed her session of dialysis on Wednesday.  She collapsed collapsed on Thursday at home, but did not immediately receive bystander CPR.  Shock administer by GFD, ROSC obtained after 8 minutes of CPR, then lost pulses again with additional 15 minutes of CPR, shocked x 3, 4 doses of epi.  Had spontaneous breathing, required family grams of Versed and 100 mcg of IV fentanyl and route due to gagging, required transcutaneous pacing for heart rate in the 40s.  Code STEMI called to the EKG showing elevations in inferior leads and and diffuse depressions.  Intubated after arrival to ED, placed on propofol and fentanyl drips.  Code STEMI called off EKG shows incomplete RBBB pattern with atrial fibrillation  Per aunt, her date of birth is 1965-09-19  Pertinent  Medical History  ESRD on HD M/W/F-left arm AV fistula HTN DM-2 Wheelchair  Significant Hospital Events: Including procedures, antibiotic start and stop dates in addition to other pertinent events   10/10 admitted following cardiac arrest. 10/12 remains unresponsive off sedation. 10/13 MRI brain with anoxic brain injury 10/14 HD, still off sedaiton; see ipal note  Interim History / Subjective:   No neuro change  Objective   Blood pressure (!) 144/47, pulse 75, temperature 98.4 F (36.9 C), resp. rate 16, height 5\' 7"  (1.702 m), weight 70.8 kg, SpO2 99%.    Vent Mode: PRVC FiO2 (%):  [40 %] 40 % Set Rate:  [20 bmp] 20 bmp Vt Set:  [490 mL] 490 mL PEEP:  [5 cmH20] 5 cmH20 Plateau Pressure:  [16 cmH20-24 cmH20] 18 cmH20   Intake/Output Summary (Last 24 hours) at 04-08-23  4098 Last data filed at April 08, 2023 0800 Gross per 24 hour  Intake 1446.45 ml  Output 170 ml  Net 1276.45 ml   Filed Weights   04/03/23 0431 04/04/23 0500 04/08/23 0500  Weight: 69.8 kg 72.1 kg 70.8 kg    Examination: General:  critically ill appearing on mech vent HEENT: MM pink/moist; ETT in place Neuro: unresponsive to pain; weak cough/gag present; breathing over vent CV: s1s2, RRR, no m/r/g PULM:  dim clear BS bilaterally; on mech vent PRVC GI: soft, bsx4 active  Extremities: warm/dry, left amputation Skin: no rashes or lesions   Ancillary tests personally reviewed   Assessment & Plan:   Witnessed VF cardiac arrest with prolonged CPR Plan: -supportive care  Severe anoxic brain jury Plan: -see ipal from 10/14 -plan for comfort care likely later today on 08-Apr-2023 when family comes to see patient; would like to keep full code for now -limit sedating meds -keppra q12  Acute hypoxic respiratory failure requiring MV post-arrest Plan: -LTVV strategy with tidal volumes of 6-8 cc/kg ideal body weight -Wean PEEP/FiO2 for SpO2 >92% -VAP bundle in place  Type 2 diabetes with uncontrolled hyperglycemia Plan: -transitioned off insulin drip -cont ssi and cbg monitoring -basal insulin  ESRD on hemodialysis Plan: -holding HD as will likely transition to comfort later today -Trend BMP / urinary output -Replace electrolytes as indicated -Avoid nephrotoxic agents, ensure adequate renal perfusion;  History of hypertension/hld Plan: -cont amlodipine, hydralazine, and propranolol -prn hydralazine and labetalol -statin  History of left lower extremity amputation for gangrene -  supportive care   Best Practice (right click and "Reselect all SmartList Selections" daily)   Diet/type: tubefeeds  DVT prophylaxis: systemic heparin GI prophylaxis: H2B Lines: N/A Foley:  N/A Code Status:  full code Last date of multidisciplinary goals of care discussion [ see ipal note 10/14;  family updated on 10/15 w/ plan to transition to comfort tomorrow after family visits patient; attempted to contact Jill Side over phone this am but no answer]   Critical care time: 35 minutes  JD Anselm Lis [facility] 04/22/23, 9:52 AM  Please see [email or web address] for pager details.  From 7A-7P if no response, please call [phone]. After hours, please call ELink [phone].

## 2023-04-21 NOTE — Progress Notes (Signed)
[facility] Progress Note  Subjective: pt seen in ICU.  MRI showed anoxic brain injury - discussion with family re: GOC underway. Possible terminal extubation later today per RN.    Vitals:   2023/04/11 0600 04/11/2023 0700 April 11, 2023 0729 04-11-23 0800  BP:   (!) 144/47   Pulse: 76 75 77 76  Resp: 17 20 20 20   Temp: 98.6 F (37 C) 98.8 F (37.1 C)  99 F (37.2 C)  TempSrc: Esophageal Esophageal  Esophageal  SpO2: 100% 96% 94% 96%  Weight:      Height:        Exam: Gen on vent, sedated Sclera anicteric, throat w/ ETT No jvd or bruits Chest clear anterior/ lateral RRR no MRG Abd soft ntnd no mass or ascites +bs MS L BKA Ext  no UE or LE edema Neuro is on vent, sedated   L AVF +bruit       Renal-related home meds: - norvasc 10 - eliquis 50 bid - lasix 20 every day - gabapentin 100 tid - renvela ? Dose - others: oxyIR, tramadol, crestor, ozempic, insulin, asa      OP HD: [facility] [address]  MWF  3.5h    53kg   300/500   LUA AVF   Heparin 2600 + 500u/hr - EPO 8400 q hd  - last HD 10/09     CXR 10/10 - no edema no congestion       Assessment/ Plan: VF cardiac arrest - out of hospital w/ prolonged CPR s/p cooling protocol.  Another brief arrest 10/13, unclear etiology but was 1hr in to HD.   Acute hypoxic resp failure - on vent support, CXR clear, stable on 40% FiO2 DM2 w/ uncontrolled hyperglycemia  ESRD - on HD MWF. HD 10/9, had early AM 10/14 - off at 8am.  Will follow daily to assess need for HD and GOC given new finding of anoxic brain injury on MRI.   No urgent indications for dialysis and in light of likely transition to comfort care later today will hold on further dialysis today.  HTN/ BP - BP tolerable - on enteral meds. Volume - acceptable currently.  Anemia esrd - Hb trending down, low 8s now high 7s. On aranesp weekly here. Transfusion per primary.  Hyperkalemia - lokelma x 1 dose today again MBD ckd - CCa and phos are in range.  No vdra or binder for now.  PAD/ hx L BKA Anoxic brain injury - noted on MRI - GOC will be addressed by primary -- likely will transition to comfort care   Estill Bakes MD [facility] Pager [phone]    Recent Labs  Lab 04/02/23 0406 04/02/23 1430 04/02/23 1433 04/04/23 0455 11-Apr-2023 0405  HGB  --  9.7*   < > 7.9* 7.1*  ALBUMIN  --  2.4*  --   --  1.7*  CALCIUM  --  9.1   < > 8.2* 8.3*  PHOS 5.1*  --   --   --  8.7*  CREATININE  --  4.28*   < > 4.49* 6.35*  K  --  4.4   < > 5.2* 5.4*   < > = values in this interval not displayed.   No results for input(s): "IRON", "TIBC", "FERRITIN" in the last 168 hours. Inpatient medications:  amLODipine  10 mg Per Tube Daily   Chlorhexidine Gluconate Cloth  6 each Topical Daily   Chlorhexidine Gluconate Cloth  6 each Topical Q0600  Darbepoetin Alfa  60 mcg Subcutaneous Q7 days   docusate  100 mg Per Tube BID   famotidine  20 mg Per Tube Daily   feeding supplement (PROSource TF20)  60 mL Per Tube BID   feeding supplement (VITAL 1.5 CAL)  1,000 mL Per Tube Q24H   glycopyrrolate  0.3 mg Intravenous TID   heparin sodium (porcine)  2,500 Units Intravenous Once   heparin sodium (porcine)  2,500 Units Intravenous Once   hydrALAZINE  50 mg Per Tube Q8H   insulin aspart  1-3 Units Subcutaneous Q4H   insulin aspart  6 Units Subcutaneous Q4H   insulin detemir  18 Units Subcutaneous Q12H   multivitamin  1 tablet Per Tube QHS   mouth rinse  15 mL Mouth Rinse Q2H   polyethylene glycol  17 g Per Tube Daily   propranolol  40 mg Per Tube TID   rosuvastatin  10 mg Oral Daily   sodium zirconium cyclosilicate  10 g Oral Once    albumin human     anticoagulant sodium citrate     dextrose     heparin 1,000 Units/hr (04/17/2023 0700)   insulin Stopped (04/12/2023 0041)   levETIRAcetam Stopped (03/30/2023 0458)   acetaminophen **OR** acetaminophen (TYLENOL) oral liquid 160 mg/5 mL **OR** acetaminophen, albumin human, alteplase,  anticoagulant sodium citrate, dextrose, dextrose, feeding supplement (NEPRO CARB STEADY), heparin, heparin, hydrALAZINE, labetalol, lidocaine (PF), lidocaine-prilocaine, ondansetron (ZOFRAN) IV, mouth rinse, pentafluoroprop-tetrafluoroeth

## 2023-04-21 NOTE — Progress Notes (Signed)
PHARMACY - ANTICOAGULATION CONSULT NOTE  Pharmacy Consult for IV heparin Indication: hx PE, DVT  No Known Allergies  Patient Measurements: Height: 5\' 7"  (170.2 cm) Weight: 70.8 kg (156 lb 1.4 oz) (w/ TTM pads) IBW/kg (Calculated) : 61.6 Heparin Dosing Weight: 72 kg  Vital Signs: Temp: 98.6 F (37 C) (10/16 1200) Temp Source: Esophageal (10/16 1200) BP: 144/47 (10/16 0729) Pulse Rate: 83 (10/16 1200)  Labs: Recent Labs    04/03/23 0425 04/04/23 0455 04/04/23 1200 04/04/23 2000 03/28/2023 0405 03/26/2023 0449  HGB 8.5* 7.9*  --   --  7.1*  --   HCT 26.0* 24.5*  --   --  22.9*  --   PLT 257 259  --   --  263  --   APTT  --   --   --  99*  --  108*  HEPARINUNFRC  --   --  0.26* 0.59  --  0.65  CREATININE 3.05* 4.49*  --   --  6.35*  --     Estimated Creatinine Clearance: 9.3 mL/min (A) (by C-G formula based on SCr of 6.35 mg/dL (H)).   Medical History: Past Medical History:  Diagnosis Date   Acute respiratory failure due to COVID-19 [facility]) 11/2022   Anemia    Asthma    Chronic kidney disease    Current every day smoker    Diabetic retinopathy (HCC)    DVT (deep venous thrombosis) (HCC)    ESRD (end stage renal disease) on dialysis (HCC)    Hyperlipidemia    Hypertension    Neuropathy    Peripheral vascular disease (HCC)    Pulmonary edema    Pulmonary embolism (HCC)    Type 2 diabetes mellitus (HCC)    Wheelchair dependence     Medications:  Infusions:   albumin human     anticoagulant sodium citrate     fentaNYL infusion INTRAVENOUS 25 mcg/hr (04/02/2023 1319)   heparin 1,000 Units/hr (04/03/2023 1326)   insulin Stopped (03/25/2023 0041)   levETIRAcetam Stopped (04/01/2023 0458)    Assessment: 57 yo female on chronic Eliquis for hx PE/DVT now s/p arrest and pharmacy asked to switch anticoagulation to IV heparin for now. Unknown timing of last Eliquis dose.  -heparin level at goal on 1000 units/hr -likely transition to comfort care   Goal of Therapy:   Heparin level 0.3-0.7 units/ml aPTT 66-102 sec Monitor platelets by anticoagulation protocol: Yes   Plan:  Continue IV heparin at 1000 units/hr  Will follow plans for goals of care  Harland German, PharmD Clinical Pharmacist **Pharmacist phone directory can now be found on [email or web address] ([email or web address]).  Listed under [facility].

## 2023-04-21 NOTE — Progress Notes (Signed)
Patient went asystole at 1852, myself and Edilia Bo, RN listened for 2 minutes and heard no heart sounds.  Time of death was pronounced at 1855.  Family at bedside.

## 2023-04-21 NOTE — Progress Notes (Signed)
15-Apr-2023 1452  Spiritual Encounters  Type of Visit Initial  Care provided to: Patient  Conversation partners present during encounter Nurse  Reason for visit Routine spiritual support   Chaplain checked in with RN to see if family meeting is still scheduled for 5:30 pm tonight and that a chaplain presence is still desired.  RN confirmed that family did confirm the details today- meeting is at 5:30, whole family will be present, and chaplain is requested to be there. This chaplain will pass the information along to on-coming on-call chaplain with a request that they be there.  Chaplain services remain available by Spiritual Consult or for emergent cases, paging [phone]  Chaplain Raelene Bott, MDiv [email or web address] [phone]

## 2023-04-21 NOTE — IPAL (Signed)
Interdisciplinary Goals of Care Family Meeting   Date carried out: 04-27-23  Location of the meeting: Bedside  Member's involved: Physician, Bedside Registered Nurse, Chaplain, and Family Member or next of kin  Durable Power of Attorney or acting medical decision maker: cousin Jill Side    Discussion: We discussed goals of care for [name] .  I met with Jill Side, RN Dorene Grebe, Chaplain to confirm plans to compassionately extubate. The children are here and will see her post-extubation. We confirmed change in code status and prepared her for these final steps. Fentany gtt, versed PRN. Has responded well to robinol today.   Code status:   Code Status: Do not attempt resuscitation (DNR) - Comfort care   Disposition: In-patient comfort care  Time spent for the meeting: 5 min.    Steffanie Dunn, DO  2023-04-27, 6:14 PM

## 2023-04-21 DEATH — deceased
